# Patient Record
Sex: Male | Born: 1975 | Race: Black or African American | Hispanic: No | Marital: Single | Smoking: Never smoker
Health system: Southern US, Community
[De-identification: ages and names within clinical notes are randomized; demographics above are authoritative.]

## PROBLEM LIST (undated history)

## (undated) HISTORY — PX: HERNIA REPAIR: SHX51

---

## 2001-04-14 ENCOUNTER — Encounter: Payer: Self-pay | Admitting: *Deleted

## 2001-04-14 ENCOUNTER — Emergency Department (HOSPITAL_COMMUNITY): Admission: EM | Admit: 2001-04-14 | Discharge: 2001-04-14 | Payer: Self-pay | Admitting: *Deleted

## 2002-01-28 ENCOUNTER — Encounter: Payer: Self-pay | Admitting: Emergency Medicine

## 2002-01-28 ENCOUNTER — Emergency Department (HOSPITAL_COMMUNITY): Admission: EM | Admit: 2002-01-28 | Discharge: 2002-01-28 | Payer: Self-pay | Admitting: Emergency Medicine

## 2003-01-29 ENCOUNTER — Emergency Department (HOSPITAL_COMMUNITY): Admission: EM | Admit: 2003-01-29 | Discharge: 2003-01-29 | Payer: Self-pay | Admitting: Emergency Medicine

## 2003-04-07 ENCOUNTER — Emergency Department (HOSPITAL_COMMUNITY): Admission: EM | Admit: 2003-04-07 | Discharge: 2003-04-07 | Payer: Self-pay | Admitting: Emergency Medicine

## 2004-02-16 ENCOUNTER — Emergency Department (HOSPITAL_COMMUNITY): Admission: EM | Admit: 2004-02-16 | Discharge: 2004-02-16 | Payer: Self-pay | Admitting: Emergency Medicine

## 2004-04-01 ENCOUNTER — Emergency Department (HOSPITAL_COMMUNITY): Admission: EM | Admit: 2004-04-01 | Discharge: 2004-04-01 | Payer: Self-pay | Admitting: Emergency Medicine

## 2004-05-20 ENCOUNTER — Emergency Department (HOSPITAL_COMMUNITY): Admission: EM | Admit: 2004-05-20 | Discharge: 2004-05-20 | Payer: Self-pay | Admitting: Emergency Medicine

## 2005-01-31 ENCOUNTER — Emergency Department (HOSPITAL_COMMUNITY): Admission: EM | Admit: 2005-01-31 | Discharge: 2005-01-31 | Payer: Self-pay | Admitting: Emergency Medicine

## 2006-04-27 ENCOUNTER — Emergency Department (HOSPITAL_COMMUNITY): Admission: EM | Admit: 2006-04-27 | Discharge: 2006-04-27 | Payer: Self-pay | Admitting: Emergency Medicine
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
## 2007-02-22 ENCOUNTER — Emergency Department (HOSPITAL_COMMUNITY): Admission: EM | Admit: 2007-02-22 | Discharge: 2007-02-22 | Payer: Self-pay | Admitting: Emergency Medicine

## 2007-06-28 ENCOUNTER — Emergency Department (HOSPITAL_COMMUNITY): Admission: EM | Admit: 2007-06-28 | Discharge: 2007-06-28 | Payer: Self-pay | Admitting: Emergency Medicine

## 2008-04-29 ENCOUNTER — Emergency Department (HOSPITAL_COMMUNITY): Admission: EM | Admit: 2008-04-29 | Discharge: 2008-04-29 | Payer: Self-pay | Admitting: Emergency Medicine

## 2008-05-09 ENCOUNTER — Emergency Department (HOSPITAL_COMMUNITY): Admission: EM | Admit: 2008-05-09 | Discharge: 2008-05-09 | Payer: Self-pay | Admitting: Emergency Medicine

## 2008-05-16 ENCOUNTER — Emergency Department (HOSPITAL_COMMUNITY): Admission: EM | Admit: 2008-05-16 | Discharge: 2008-05-16 | Payer: Self-pay | Admitting: Emergency Medicine

## 2009-06-09 ENCOUNTER — Emergency Department (HOSPITAL_BASED_OUTPATIENT_CLINIC_OR_DEPARTMENT_OTHER): Admission: EM | Admit: 2009-06-09 | Discharge: 2009-06-09 | Payer: Self-pay | Admitting: Emergency Medicine

## 2009-10-04 ENCOUNTER — Emergency Department (HOSPITAL_COMMUNITY): Admission: EM | Admit: 2009-10-04 | Discharge: 2009-10-04 | Payer: Self-pay | Admitting: Emergency Medicine

## 2009-10-11 ENCOUNTER — Emergency Department (HOSPITAL_COMMUNITY): Admission: EM | Admit: 2009-10-11 | Discharge: 2009-10-11 | Payer: Self-pay | Admitting: Emergency Medicine

## 2009-11-16 ENCOUNTER — Emergency Department (HOSPITAL_BASED_OUTPATIENT_CLINIC_OR_DEPARTMENT_OTHER): Admission: EM | Admit: 2009-11-16 | Discharge: 2009-11-16 | Payer: Self-pay | Admitting: Emergency Medicine

## 2009-12-10 ENCOUNTER — Emergency Department (HOSPITAL_BASED_OUTPATIENT_CLINIC_OR_DEPARTMENT_OTHER): Admission: EM | Admit: 2009-12-10 | Discharge: 2009-12-10 | Payer: Self-pay | Admitting: Emergency Medicine

## 2009-12-18 ENCOUNTER — Emergency Department (HOSPITAL_BASED_OUTPATIENT_CLINIC_OR_DEPARTMENT_OTHER): Admission: EM | Admit: 2009-12-18 | Discharge: 2009-12-18 | Payer: Self-pay | Admitting: Emergency Medicine

## 2011-06-01 ENCOUNTER — Emergency Department (HOSPITAL_BASED_OUTPATIENT_CLINIC_OR_DEPARTMENT_OTHER)
Admission: EM | Admit: 2011-06-01 | Discharge: 2011-06-01 | Disposition: A | Discharge status: Home / Self Care | Payer: Self-pay | Attending: Emergency Medicine | Admitting: Emergency Medicine

## 2011-06-01 DIAGNOSIS — S335XXA Sprain of ligaments of lumbar spine, initial encounter: Secondary | ICD-10-CM | POA: Insufficient documentation

## 2011-06-01 DIAGNOSIS — S39012A Strain of muscle, fascia and tendon of lower back, initial encounter: Secondary | ICD-10-CM

## 2011-06-01 DIAGNOSIS — X58XXXA Exposure to other specified factors, initial encounter: Secondary | ICD-10-CM | POA: Insufficient documentation

## 2011-06-01 DIAGNOSIS — M5430 Sciatica, unspecified side: Secondary | ICD-10-CM | POA: Insufficient documentation

## 2011-06-01 MED ORDER — IBUPROFEN 800 MG PO TABS: 800.0000 mg | ORAL_TABLET | Freq: Three times a day (TID) | ORAL | Status: AC

## 2011-06-01 MED ORDER — PREDNISONE 10 MG PO TABS
50.0000 mg | ORAL_TABLET | Freq: Once | ORAL | Status: AC
  Administered 2011-06-01: 50 mg via ORAL
  Filled 2011-06-01: qty 5

## 2011-06-01 MED ORDER — PERCOCET 5-325 MG PO TABS: 1.0000 | ORAL_TABLET | Freq: Four times a day (QID) | ORAL | Status: AC | PRN

## 2011-06-01 MED ORDER — DIAZEPAM 5 MG PO TABS: 5.0000 mg | ORAL_TABLET | Freq: Two times a day (BID) | ORAL | Status: AC

## 2011-06-01 MED ORDER — KETOROLAC TROMETHAMINE 30 MG/ML IJ SOLN
30.0000 mg | Freq: Once | INTRAMUSCULAR | Status: AC
  Administered 2011-06-01: 30 mg via INTRAMUSCULAR
  Filled 2011-06-01: qty 1

## 2011-06-01 NOTE — ED Notes (Signed)
Pt reports back pain, radiating to bilateral legs x 6 days.  He denies a specific injury.

## 2011-06-01 NOTE — ED Provider Notes (Addendum)
History     CSN: [id number] Arrival date & time: 06/01/2011 10:57 AM   No chief complaint on file.    (Include location/radiation/quality/duration/timing/severity/associated sxs/prior treatment) Patient is a 35 y.o. male presenting with back pain. The history is provided by the patient.  Back Pain  This is a recurrent problem. The symptoms are aggravated by certain positions.   the patient is currently in a weightlifting class. He is unsure whether he injured his back lifting weights he did have a similar problem years ago. The pain is mostly along the bilateral lower back with some radiation to the buttock and legs. He denies any difficulty with bowels or bladder he does have occasional tingling in his legs. The pain is made worse by coughing or sitting position. He denies any focal motor deficits taste denies any abdominal pain fever or any other systemic symptoms   No past medical history on file.   No past surgical history on file.  No family history on file.  History  Substance Use Topics  . Smoking status: Not on file  . Smokeless tobacco: Not on file  . Alcohol Use: Not on file      Review of Systems  Musculoskeletal: Positive for back pain.  All other systems reviewed and are negative.    Allergies  Review of patient's allergies indicates not on file.  Home Medications  No current outpatient prescriptions on file.  Physical Exam    There were no vitals taken for this visit.  Physical Exam  Constitutional: He is oriented to person, place, and time. He appears well-developed and well-nourished.  HENT:  Head: Normocephalic and atraumatic.  Eyes: Conjunctivae and EOM are normal. Pupils are equal, round, and reactive to light.  Neck: Neck supple.  Cardiovascular: Normal rate and regular rhythm.  Exam reveals no gallop and no friction rub.   No murmur heard. Pulmonary/Chest: Breath sounds normal. He has no wheezes. He has no rales. He exhibits no tenderness.    Abdominal: Soft. Bowel sounds are normal. He exhibits no distension. There is no tenderness. There is no rebound and no guarding.  Musculoskeletal: Normal range of motion.  Neurological: He is alert and oriented to person, place, and time. He displays normal reflexes. No cranial nerve deficit. He exhibits normal muscle tone. Coordination normal.  Skin: Skin is warm and dry. No rash noted.  Psychiatric: He has a normal mood and affect.    ED Course  Procedures  No results found for this or any previous visit. No results found.   No diagnosis found.   MDM Pt is seen and examined;  Initial history and physical completed.  Will follow.         Shacoya Burkhammer A. Patrica Duel, MD 06/01/11 1110  Ladasha Schnackenberg A. Patrica Duel, MD 06/01/11 1217  Pain likely consistent with a bulging disc or sciatica. Patient is given medications for pain and muscle relaxation anti-inflammatory effect he is referred to both the orthopedic specialist as well as a sports medicine specialist patient was strongly encouraged to pursue outpatient followup he is encouraged to return to the ED at any time for concerns or changing symptoms he is discharged home in stable and improved condition  Beatriz Settles A. Patrica Duel, MD 06/01/11 1219

## 2011-06-01 NOTE — ED Notes (Signed)
MD at bedside. 

## 2011-06-16 LAB — POCT I-STAT, CHEM 8
BUN: 17
Creatinine, Ser: 1.3
Glucose, Bld: 89
HCT: 46

## 2012-10-24 ENCOUNTER — Telehealth (HOSPITAL_COMMUNITY): Payer: Self-pay | Admitting: Emergency Medicine

## 2012-10-24 ENCOUNTER — Emergency Department (HOSPITAL_BASED_OUTPATIENT_CLINIC_OR_DEPARTMENT_OTHER)
Admission: EM | Admit: 2012-10-24 | Discharge: 2012-10-24 | Disposition: A | Discharge status: Home / Self Care | Payer: Self-pay | Attending: Emergency Medicine | Admitting: Emergency Medicine

## 2012-10-24 ENCOUNTER — Encounter (HOSPITAL_BASED_OUTPATIENT_CLINIC_OR_DEPARTMENT_OTHER): Payer: Self-pay | Admitting: Family Medicine

## 2012-10-24 DIAGNOSIS — Z9889 Other specified postprocedural states: Secondary | ICD-10-CM | POA: Insufficient documentation

## 2012-10-24 DIAGNOSIS — R509 Fever, unspecified: Secondary | ICD-10-CM | POA: Insufficient documentation

## 2012-10-24 DIAGNOSIS — K0889 Other specified disorders of teeth and supporting structures: Secondary | ICD-10-CM

## 2012-10-24 DIAGNOSIS — K089 Disorder of teeth and supporting structures, unspecified: Secondary | ICD-10-CM | POA: Insufficient documentation

## 2012-10-24 MED ORDER — OXYCODONE-ACETAMINOPHEN 5-325 MG PO TABS: 1.0000 | ORAL_TABLET | Freq: Four times a day (QID) | ORAL | Status: DC | PRN

## 2012-10-24 MED ORDER — PENICILLIN V POTASSIUM 500 MG PO TABS: 500.0000 mg | ORAL_TABLET | Freq: Four times a day (QID) | ORAL | Status: AC

## 2012-10-24 NOTE — ED Provider Notes (Signed)
History     CSN: [id number]  Arrival date & time 10/24/12  4098   First MD Initiated Contact with Patient 10/24/12 0913      Chief Complaint  Patient presents with  . Dental Pain    (Consider location/radiation/quality/duration/timing/severity/associated sxs/prior treatment) HPI PT reports moderate to severe aching pain in L lower 1st molar about 2 weeks ago improved with ibuprofen but returned and more severe last night. Subjective fever at home, but no drainage. History reviewed. No pertinent past medical history.  History reviewed. No pertinent past surgical history.  No family history on file.  History  Substance Use Topics  . Smoking status: Never Smoker   . Smokeless tobacco: Never Used  . Alcohol Use: Yes      Review of Systems All other systems reviewed and are negative except as noted in HPI.   Allergies  Review of patient's allergies indicates no known allergies.  Home Medications  No current outpatient prescriptions on file.  BP 144/101  Pulse 62  Temp 98.4 F (36.9 C) (Oral)  Resp 16  Ht 6' (1.829 m)  Wt 185 lb (83.915 kg)  BMI 25.09 kg/m2  SpO2 100%  Physical Exam  Nursing note and vitals reviewed. Constitutional: He is oriented to person, place, and time. He appears well-developed and well-nourished.  HENT:  Head: Normocephalic and atraumatic.  Mouth/Throat:         The left lower first molar is implanted medially at baseline, no abscess  Eyes: EOM are normal. Pupils are equal, round, and reactive to light.  Neck: Normal range of motion. Neck supple.  Cardiovascular: Normal rate, normal heart sounds and intact distal pulses.   Pulmonary/Chest: Effort normal and breath sounds normal.  Abdominal: Bowel sounds are normal. He exhibits no distension. There is no tenderness.  Musculoskeletal: Normal range of motion. He exhibits no edema and no tenderness.  Neurological: He is alert and oriented to person, place, and time. He has normal  strength. No cranial nerve deficit or sensory deficit.  Skin: Skin is warm and dry. No rash noted.  Psychiatric: He has a normal mood and affect.    ED Course  Procedures (including critical care time)  Labs Reviewed - No data to display No results found.   No diagnosis found.    MDM  Pt with congenitally abnormally located tooth, will begin PCN/Percocet, advised close dental follow up for extraction        Charles B. Bernette Mayers, MD 10/24/12 [phone]

## 2012-10-24 NOTE — ED Notes (Signed)
Call from on call dentist requesting demographics and referral be faxed to their office. 

## 2012-10-24 NOTE — ED Notes (Addendum)
Pt c/o left lower dental pain for "a while" and sts he does not have a dentist. Pt concerned about infection. No fever. Pt reports taking 800mg  Ibuprofen about an hour ago.

## 2012-11-05 ENCOUNTER — Encounter (HOSPITAL_BASED_OUTPATIENT_CLINIC_OR_DEPARTMENT_OTHER): Payer: Self-pay | Admitting: *Deleted

## 2012-11-05 ENCOUNTER — Emergency Department (HOSPITAL_BASED_OUTPATIENT_CLINIC_OR_DEPARTMENT_OTHER)
Admission: EM | Admit: 2012-11-05 | Discharge: 2012-11-05 | Disposition: A | Discharge status: Home / Self Care | Payer: Self-pay | Attending: Emergency Medicine | Admitting: Emergency Medicine

## 2012-11-05 DIAGNOSIS — K0889 Other specified disorders of teeth and supporting structures: Secondary | ICD-10-CM

## 2012-11-05 DIAGNOSIS — R52 Pain, unspecified: Secondary | ICD-10-CM | POA: Insufficient documentation

## 2012-11-05 DIAGNOSIS — IMO0001 Reserved for inherently not codable concepts without codable children: Secondary | ICD-10-CM | POA: Insufficient documentation

## 2012-11-05 DIAGNOSIS — K089 Disorder of teeth and supporting structures, unspecified: Secondary | ICD-10-CM | POA: Insufficient documentation

## 2012-11-05 MED ORDER — PENICILLIN V POTASSIUM 500 MG PO TABS: 500.0000 mg | ORAL_TABLET | Freq: Four times a day (QID) | ORAL | Status: DC

## 2012-11-05 MED ORDER — HYDROCODONE-ACETAMINOPHEN 5-325 MG PO TABS
1.0000 | ORAL_TABLET | Freq: Four times a day (QID) | ORAL | Status: DC | PRN
Start: 2012-11-05 — End: 2022-01-19

## 2012-11-05 NOTE — ED Notes (Signed)
Had 3 teeth extracted 11 days ago after being referred to dr Lucky Cowboy from ED states started having worse pain in the extraction area on Sunday and is getting worse also reports having body aches onset yesterday

## 2012-11-05 NOTE — ED Provider Notes (Signed)
History     CSN: [id number]  Arrival date & time 11/05/12  1023   First MD Initiated Contact with Patient 11/05/12 1041      Chief Complaint  Patient presents with  . Dental Pain  . Generalized Body Aches    (Consider location/radiation/quality/duration/timing/severity/associated sxs/prior treatment) Patient is a 37 y.o. male presenting with tooth pain. The history is provided by the patient.  Dental PainPrimary symptoms do not include headaches, fever or shortness of breath.  Additional symptoms do not include: trouble swallowing.   status post extraction of 3 teeth about one week ago. By Dr. Lucky Cowboy of dentistry. Patient now has increased pain at the site of the 3 extractions. Associated with bodyaches feeling. The abdominal pain is 8/10. All related to the left lower part of the jaw. That is the side of the extractions.  History reviewed. No pertinent past medical history.  History reviewed. No pertinent past surgical history.  History reviewed. No pertinent family history.  History  Substance Use Topics  . Smoking status: Never Smoker   . Smokeless tobacco: Never Used  . Alcohol Use: Yes      Review of Systems  Constitutional: Negative for fever and chills.  HENT: Positive for dental problem. Negative for congestion, trouble swallowing and neck stiffness.   Eyes: Negative for redness.  Respiratory: Negative for shortness of breath.   Cardiovascular: Negative for chest pain.  Gastrointestinal: Negative for abdominal pain.  Genitourinary: Negative for dysuria.  Musculoskeletal: Positive for myalgias.  Skin: Negative for rash.  Neurological: Negative for headaches.  Hematological: Does not bruise/bleed easily.    Allergies  Review of patient's allergies indicates no known allergies.  Home Medications   Current Outpatient Rx  Name  Route  Sig  Dispense  Refill  . HYDROcodone-acetaminophen (NORCO/VICODIN) 5-325 MG per tablet   Oral   Take 1-2 tablets by mouth  every 6 (six) hours as needed for pain.   15 tablet   0   . oxyCODONE-acetaminophen (PERCOCET/ROXICET) 5-325 MG per tablet   Oral   Take 1-2 tablets by mouth every 6 (six) hours as needed for pain.   20 tablet   0   . penicillin v potassium (VEETID) 500 MG tablet   Oral   Take 1 tablet (500 mg total) by mouth 4 (four) times daily.   40 tablet   0     SpO2 99%  Physical Exam  Nursing note and vitals reviewed. Constitutional: He is oriented to person, place, and time. He appears well-developed and well-nourished.  HENT:  Head: Normocephalic and atraumatic.  Left lower jaw area with healing tooth extraction sites. Some gum swelling no erythema no discharge. No facial swelling.  Eyes: Conjunctivae and EOM are normal. Pupils are equal, round, and reactive to light.  Neck: Normal range of motion. Neck supple.  Cardiovascular: Normal rate, regular rhythm and normal heart sounds.   No murmur heard. Pulmonary/Chest: Effort normal and breath sounds normal.  Abdominal: Soft. Bowel sounds are normal. There is no tenderness.  Lymphadenopathy:    He has no cervical adenopathy.  Neurological: He is alert and oriented to person, place, and time. No cranial nerve deficit. He exhibits normal muscle tone. Coordination normal.  Skin: No rash noted.    ED Course  Procedures (including critical care time)  Labs Reviewed - No data to display No results found.   1. Pain, dental       MDM   Patient status post extraction of 3 teeth one  week ago by Dr. Lucky Cowboy. Patient having some increased pain of late. There is some gum swelling but no evidence of erythema or discharge. Will treat again with penicillin and pain medications and refer back to Dr. Lucky Cowboy.        Shelda Jakes, MD 11/05/12 1106

## 2014-05-07 ENCOUNTER — Emergency Department (HOSPITAL_BASED_OUTPATIENT_CLINIC_OR_DEPARTMENT_OTHER)
Admission: EM | Admit: 2014-05-07 | Discharge: 2014-05-07 | Disposition: A | Discharge status: Home / Self Care | Payer: Self-pay | Attending: Emergency Medicine | Admitting: Emergency Medicine

## 2014-05-07 ENCOUNTER — Encounter (HOSPITAL_BASED_OUTPATIENT_CLINIC_OR_DEPARTMENT_OTHER): Payer: Self-pay | Admitting: Emergency Medicine

## 2014-05-07 ENCOUNTER — Emergency Department (HOSPITAL_BASED_OUTPATIENT_CLINIC_OR_DEPARTMENT_OTHER): Payer: Self-pay

## 2014-05-07 DIAGNOSIS — W19XXXA Unspecified fall, initial encounter: Secondary | ICD-10-CM

## 2014-05-07 DIAGNOSIS — Z792 Long term (current) use of antibiotics: Secondary | ICD-10-CM | POA: Insufficient documentation

## 2014-05-07 DIAGNOSIS — Z791 Long term (current) use of non-steroidal anti-inflammatories (NSAID): Secondary | ICD-10-CM | POA: Insufficient documentation

## 2014-05-07 DIAGNOSIS — S8990XA Unspecified injury of unspecified lower leg, initial encounter: Secondary | ICD-10-CM | POA: Insufficient documentation

## 2014-05-07 DIAGNOSIS — S60229A Contusion of unspecified hand, initial encounter: Secondary | ICD-10-CM | POA: Insufficient documentation

## 2014-05-07 DIAGNOSIS — Y9389 Activity, other specified: Secondary | ICD-10-CM | POA: Insufficient documentation

## 2014-05-07 DIAGNOSIS — M25562 Pain in left knee: Secondary | ICD-10-CM

## 2014-05-07 DIAGNOSIS — S99919A Unspecified injury of unspecified ankle, initial encounter: Secondary | ICD-10-CM

## 2014-05-07 DIAGNOSIS — Y929 Unspecified place or not applicable: Secondary | ICD-10-CM | POA: Insufficient documentation

## 2014-05-07 DIAGNOSIS — Z79899 Other long term (current) drug therapy: Secondary | ICD-10-CM | POA: Insufficient documentation

## 2014-05-07 DIAGNOSIS — S60221A Contusion of right hand, initial encounter: Secondary | ICD-10-CM

## 2014-05-07 DIAGNOSIS — S6990XA Unspecified injury of unspecified wrist, hand and finger(s), initial encounter: Secondary | ICD-10-CM | POA: Insufficient documentation

## 2014-05-07 DIAGNOSIS — S99929A Unspecified injury of unspecified foot, initial encounter: Secondary | ICD-10-CM

## 2014-05-07 DIAGNOSIS — R296 Repeated falls: Secondary | ICD-10-CM | POA: Insufficient documentation

## 2014-05-07 MED ORDER — IBUPROFEN 800 MG PO TABS
800.0000 mg | ORAL_TABLET | Freq: Once | ORAL | Status: AC
  Administered 2014-05-07: 800 mg via ORAL
  Filled 2014-05-07: qty 1

## 2014-05-07 MED ORDER — OXYCODONE-ACETAMINOPHEN 5-325 MG PO TABS
2.0000 | ORAL_TABLET | Freq: Once | ORAL | Status: AC
  Administered 2014-05-07: 2 via ORAL
  Filled 2014-05-07: qty 2

## 2014-05-07 MED ORDER — IBUPROFEN 800 MG PO TABS: 800.0000 mg | ORAL_TABLET | Freq: Three times a day (TID) | ORAL | Status: DC
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
## 2014-05-07 NOTE — Discharge Instructions (Signed)
Contusion °A contusion is a deep bruise. Contusions are the result of an injury that caused bleeding under the skin. The contusion may turn blue, purple, or yellow. Minor injuries will give you a painless contusion, but more severe contusions may stay painful and swollen for a few weeks.  °CAUSES  °A contusion is usually caused by a blow, trauma, or direct force to an area of the body. °SYMPTOMS  °· Swelling and redness of the injured area. °· Bruising of the injured area. °· Tenderness and soreness of the injured area. °· Pain. °DIAGNOSIS  °The diagnosis can be made by taking a history and physical exam. An X-Mikailah Morel, CT scan, or MRI may be needed to determine if there were any associated injuries, such as fractures. °TREATMENT  °Specific treatment will depend on what area of the body was injured. In general, the best treatment for a contusion is resting, icing, elevating, and applying cold compresses to the injured area. Over-the-counter medicines may also be recommended for pain control. Ask your caregiver what the best treatment is for your contusion. °HOME CARE INSTRUCTIONS  °· Put ice on the injured area. °¨ Put ice in a plastic bag. °¨ Place a towel between your skin and the bag. °¨ Leave the ice on for 15-20 minutes, 3-4 times a day, or as directed by your health care provider. °· Only take over-the-counter or prescription medicines for pain, discomfort, or fever as directed by your caregiver. Your caregiver may recommend avoiding anti-inflammatory medicines (aspirin, ibuprofen, and naproxen) for 48 hours because these medicines may increase bruising. °· Rest the injured area. °· If possible, elevate the injured area to reduce swelling. °SEEK IMMEDIATE MEDICAL CARE IF:  °· You have increased bruising or swelling. °· You have pain that is getting worse. °· Your swelling or pain is not relieved with medicines. °MAKE SURE YOU:  °· Understand these instructions. °· Will watch your condition. °· Will get help right  away if you are not doing well or get worse. °Document Released: 06/14/2005 Document Revised: 09/09/2013 Document Reviewed: 07/10/2011 °[employer]® Patient Information ©2015 [employer]. This information is not intended to replace advice given to you by your health care provider. Make sure you discuss any questions you have with your health care provider. ° °

## 2014-05-07 NOTE — ED Notes (Signed)
Pt c/o fall from standing this am injuring left knee and right hand .

## 2014-05-13 NOTE — ED Provider Notes (Signed)
CSN: [id number]     Arrival date & time 05/07/14  1407 History   First MD Initiated Contact with Patient 05/07/14 1414     Chief Complaint  Patient presents with  . Fall     (Consider location/radiation/quality/duration/timing/severity/associated sxs/prior Treatment) HPI Comments: Patient fell from standing position complains of pain to right hand and left knee.    Patient is a 38 y.o. male presenting with fall. The history is provided by the patient.  Fall This is a new problem. The current episode started 3 to 5 hours ago. The problem occurs constantly. The problem has not changed since onset.Pertinent negatives include no chest pain, no abdominal pain, no headaches and no shortness of breath. The symptoms are aggravated by walking and twisting. The symptoms are relieved by position. He has tried nothing for the symptoms. The treatment provided no relief.    History reviewed. No pertinent past medical history. History reviewed. No pertinent past surgical history. History reviewed. No pertinent family history. History  Substance Use Topics  . Smoking status: Never Smoker   . Smokeless tobacco: Never Used  . Alcohol Use: Yes    Review of Systems  Respiratory: Negative for shortness of breath.   Cardiovascular: Negative for chest pain.  Gastrointestinal: Negative for abdominal pain.  Neurological: Negative for headaches.  All other systems reviewed and are negative.     Allergies  Review of patient's allergies indicates no known allergies.  Home Medications   Prior to Admission medications   Medication Sig Start Date End Date Taking? Authorizing Provider  HYDROcodone-acetaminophen (NORCO/VICODIN) 5-325 MG per tablet Take 1-2 tablets by mouth every 6 (six) hours as needed for pain. 11/05/12   Vanetta Mulders, MD  ibuprofen (ADVIL,MOTRIN) 800 MG tablet Take 1 tablet (800 mg total) by mouth 3 (three) times daily. 05/07/14   Hilario Quarry, MD  oxyCODONE-acetaminophen  (PERCOCET/ROXICET) 5-325 MG per tablet Take 1-2 tablets by mouth every 6 (six) hours as needed for pain. 10/24/12   Charles B. Bernette Mayers, MD  penicillin v potassium (VEETID) 500 MG tablet Take 1 tablet (500 mg total) by mouth 4 (four) times daily. 11/05/12   Vanetta Mulders, MD   BP 135/75  Pulse 69  Temp(Src) 98.2 F (36.8 C) (Oral)  Resp 16  Ht 6' (1.829 m)  Wt 190 lb (86.183 kg)  BMI 25.76 kg/m2  SpO2 98% Physical Exam  Nursing note and vitals reviewed. Constitutional: He is oriented to person, place, and time. He appears well-developed and well-nourished.  HENT:  Head: Normocephalic and atraumatic.  Right Ear: External ear normal.  Left Ear: External ear normal.  Nose: Nose normal.  Mouth/Throat: Oropharynx is clear and moist.  Eyes: Conjunctivae and EOM are normal. Pupils are equal, round, and reactive to light.  Neck: Normal range of motion. Neck supple.  Cardiovascular: Normal rate, regular rhythm, normal heart sounds and intact distal pulses.   Pulmonary/Chest: Effort normal and breath sounds normal. No respiratory distress. He has no wheezes. He exhibits no tenderness.  Abdominal: Soft. Bowel sounds are normal. He exhibits no distension and no mass. There is no tenderness. There is no guarding.  Musculoskeletal: Normal range of motion.  Moderate ttp ulnar aspect of right hand, no deformity, no loss of sensation MIld ttp left knee, no deformity, full arom, no ligamentous laxity noted.    Neurological: He is alert and oriented to person, place, and time. He has normal reflexes. He exhibits normal muscle tone. Coordination normal.  Skin: Skin is warm and  dry.  Psychiatric: He has a normal mood and affect. His behavior is normal. Judgment and thought content normal.    ED Course  Procedures (including critical care time) Labs Review Dg Knee Complete 4 Views Left  05/07/2014   CLINICAL DATA:  Fall, twisting injury, posterior pain  EXAM: LEFT KNEE - COMPLETE 4+ VIEW  COMPARISON:   None.  FINDINGS: There is no evidence of fracture, dislocation, or joint effusion. There is no evidence of arthropathy or other focal bone abnormality. Soft tissues are unremarkable.  IMPRESSION: No acute osseous finding   Electronically Signed   By: Ruel Favors M.D.   On: 05/07/2014 14:35   Dg Hand Complete Right  05/07/2014   CLINICAL DATA:  FALL.  Swelling and pain in the hand.  EXAM: RIGHT HAND - COMPLETE 3+ VIEW  COMPARISON:  04/27/2006  FINDINGS: There is no evidence of fracture or dislocation. There is no evidence of arthropathy or other focal bone abnormality. Soft tissues are unremarkable.  IMPRESSION: Negative.   Electronically Signed   By: Rosalie Gums M.D.   On: 05/07/2014 14:34    MDM   Final diagnoses:  Fall, initial encounter  Hand contusion, right, initial encounter  Knee pain, acute, left        Hilario Quarry, MD 05/13/14 [phone]

## 2014-06-30 ENCOUNTER — Emergency Department (HOSPITAL_BASED_OUTPATIENT_CLINIC_OR_DEPARTMENT_OTHER): Payer: No Typology Code available for payment source

## 2014-06-30 ENCOUNTER — Emergency Department (HOSPITAL_BASED_OUTPATIENT_CLINIC_OR_DEPARTMENT_OTHER)
Admission: EM | Admit: 2014-06-30 | Discharge: 2014-06-30 | Disposition: A | Discharge status: Home / Self Care | Payer: No Typology Code available for payment source | Attending: Emergency Medicine | Admitting: Emergency Medicine

## 2014-06-30 ENCOUNTER — Encounter (HOSPITAL_BASED_OUTPATIENT_CLINIC_OR_DEPARTMENT_OTHER): Payer: Self-pay | Admitting: Emergency Medicine

## 2014-06-30 DIAGNOSIS — Z792 Long term (current) use of antibiotics: Secondary | ICD-10-CM | POA: Insufficient documentation

## 2014-06-30 DIAGNOSIS — Y9389 Activity, other specified: Secondary | ICD-10-CM | POA: Insufficient documentation

## 2014-06-30 DIAGNOSIS — Z79899 Other long term (current) drug therapy: Secondary | ICD-10-CM | POA: Insufficient documentation

## 2014-06-30 DIAGNOSIS — Z791 Long term (current) use of non-steroidal anti-inflammatories (NSAID): Secondary | ICD-10-CM | POA: Diagnosis not present

## 2014-06-30 DIAGNOSIS — S199XXA Unspecified injury of neck, initial encounter: Secondary | ICD-10-CM | POA: Insufficient documentation

## 2014-06-30 DIAGNOSIS — R202 Paresthesia of skin: Secondary | ICD-10-CM | POA: Diagnosis not present

## 2014-06-30 DIAGNOSIS — S0990XA Unspecified injury of head, initial encounter: Secondary | ICD-10-CM | POA: Diagnosis not present

## 2014-06-30 DIAGNOSIS — M542 Cervicalgia: Secondary | ICD-10-CM

## 2014-06-30 DIAGNOSIS — Y92481 Parking lot as the place of occurrence of the external cause: Secondary | ICD-10-CM | POA: Diagnosis not present

## 2014-06-30 MED ORDER — HYDROCODONE-ACETAMINOPHEN 5-325 MG PO TABS
2.0000 | ORAL_TABLET | Freq: Once | ORAL | Status: AC
  Administered 2014-06-30: 2 via ORAL
  Filled 2014-06-30: qty 2

## 2014-06-30 MED ORDER — OXYCODONE-ACETAMINOPHEN 5-325 MG PO TABS: 1.0000 | ORAL_TABLET | Freq: Four times a day (QID) | ORAL | Status: DC | PRN

## 2014-06-30 NOTE — ED Notes (Signed)
Applied universal aspen collar and reviewed with pt.

## 2014-06-30 NOTE — ED Notes (Signed)
Instructed pt that he would need to wear the aspen collar 24 hours a day until seen by neurosurgeon.  Pt related verbal understanding.
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
## 2014-06-30 NOTE — ED Notes (Signed)
Applied [place] collar to pt while waiting for neurosurgery consult
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
## 2014-11-03 ENCOUNTER — Emergency Department (HOSPITAL_COMMUNITY)
Admission: EM | Admit: 2014-11-03 | Discharge: 2014-11-03 | Disposition: A | Discharge status: Home / Self Care | Payer: BLUE CROSS/BLUE SHIELD | Attending: Emergency Medicine | Admitting: Emergency Medicine

## 2014-11-03 ENCOUNTER — Encounter (HOSPITAL_COMMUNITY): Payer: Self-pay | Admitting: Emergency Medicine

## 2014-11-03 ENCOUNTER — Emergency Department (HOSPITAL_COMMUNITY): Payer: BLUE CROSS/BLUE SHIELD

## 2014-11-03 DIAGNOSIS — M7918 Myalgia, other site: Secondary | ICD-10-CM

## 2014-11-03 DIAGNOSIS — Z792 Long term (current) use of antibiotics: Secondary | ICD-10-CM | POA: Insufficient documentation

## 2014-11-03 DIAGNOSIS — M25562 Pain in left knee: Secondary | ICD-10-CM

## 2014-11-03 DIAGNOSIS — W009XXA Unspecified fall due to ice and snow, initial encounter: Secondary | ICD-10-CM | POA: Insufficient documentation

## 2014-11-03 DIAGNOSIS — Y9289 Other specified places as the place of occurrence of the external cause: Secondary | ICD-10-CM | POA: Diagnosis not present

## 2014-11-03 DIAGNOSIS — Z791 Long term (current) use of non-steroidal anti-inflammatories (NSAID): Secondary | ICD-10-CM | POA: Diagnosis not present

## 2014-11-03 DIAGNOSIS — S3992XA Unspecified injury of lower back, initial encounter: Secondary | ICD-10-CM | POA: Diagnosis not present

## 2014-11-03 DIAGNOSIS — S8992XA Unspecified injury of left lower leg, initial encounter: Secondary | ICD-10-CM | POA: Insufficient documentation

## 2014-11-03 DIAGNOSIS — Z79899 Other long term (current) drug therapy: Secondary | ICD-10-CM | POA: Diagnosis not present

## 2014-11-03 DIAGNOSIS — Y9389 Activity, other specified: Secondary | ICD-10-CM | POA: Diagnosis not present

## 2014-11-03 DIAGNOSIS — Y998 Other external cause status: Secondary | ICD-10-CM | POA: Diagnosis not present

## 2014-11-03 MED ORDER — IBUPROFEN 600 MG PO TABS: 600.0000 mg | ORAL_TABLET | Freq: Four times a day (QID) | ORAL | Status: DC | PRN

## 2014-11-03 MED ORDER — TRAMADOL HCL 50 MG PO TABS: 50.0000 mg | ORAL_TABLET | Freq: Four times a day (QID) | ORAL | Status: DC | PRN

## 2014-11-03 MED ORDER — IBUPROFEN 400 MG PO TABS
600.0000 mg | ORAL_TABLET | Freq: Once | ORAL | Status: AC
  Administered 2014-11-03: 600 mg via ORAL
  Filled 2014-11-03 (×2): qty 1

## 2014-11-03 MED ORDER — DIAZEPAM 5 MG PO TABS: 5.0000 mg | ORAL_TABLET | Freq: Two times a day (BID) | ORAL | Status: DC

## 2014-11-03 NOTE — ED Notes (Signed)
Slipped on concrete steps last pm. C/o LBP and left knee pain. No deformity noted.

## 2014-11-03 NOTE — ED Provider Notes (Signed)
CSN: [id number]     Arrival date & time 11/03/14  1013 History  This chart was scribed for non-physician practitioner, Junius FinnerErin O'Malley, PA-C working with Arby BarretteMarcy Pfeiffer, MD by Greggory StallionKayla Andersen, ED scribe. This patient was seen in room TR11C/TR11C and the patient's care was started at 11:01 AM.   Chief Complaint  Patient presents with  . Fall  . Back Pain  . Knee Pain   The history is provided by the patient. No language interpreter was used.    HPI Comments: Edwin SettingDayton J Berry is a 39 y.o. male who presents to the Emergency Department complaining of a fall that occurred around 2 AM today. Pt slipped on concrete steps, fell onto his buttocks and hyperextended his knee. He reports worsening lower back pain and left knee pain. Bearing weight and bending his knee worsens pain. Pain is aching and sore, 10/10 at worst. Certain movements worsen back pain. He has not yet taken any medications. Pt has injured his left knee in the past and has had intermittent pain since but denies knee surgeries. Denies change in bowel or bladder habits. Denies hitting his head during the fall.  History reviewed. No pertinent past medical history. History reviewed. No pertinent past surgical history. No family history on file. History  Substance Use Topics  . Smoking status: Never Smoker   . Smokeless tobacco: Never Used  . Alcohol Use: Yes     Comment: occ    Review of Systems  Musculoskeletal: Positive for back pain and arthralgias.  All other systems reviewed and are negative.  Allergies  Review of patient's allergies indicates no known allergies.  Home Medications   Prior to Admission medications   Medication Sig Start Date End Date Taking? Authorizing Provider  diazepam (VALIUM) 5 MG tablet Take 1 tablet (5 mg total) by mouth 2 (two) times daily. 11/03/14   Junius FinnerErin O'Malley, PA-C  HYDROcodone-acetaminophen (NORCO/VICODIN) 5-325 MG per tablet Take 1-2 tablets by mouth every 6 (six) hours as needed for pain.  11/05/12   Vanetta MuldersScott Zackowski, MD  ibuprofen (ADVIL,MOTRIN) 600 MG tablet Take 1 tablet (600 mg total) by mouth every 6 (six) hours as needed. 11/03/14   Junius FinnerErin O'Malley, PA-C  ibuprofen (ADVIL,MOTRIN) 800 MG tablet Take 1 tablet (800 mg total) by mouth 3 (three) times daily. 05/07/14   Hilario Quarryanielle S Ray, MD  oxyCODONE-acetaminophen (PERCOCET) 5-325 MG per tablet Take 1 tablet by mouth every 6 (six) hours as needed. 06/30/14   Toy CookeyMegan Docherty, MD  oxyCODONE-acetaminophen (PERCOCET/ROXICET) 5-325 MG per tablet Take 1-2 tablets by mouth every 6 (six) hours as needed for pain. 10/24/12   Charles B. Bernette MayersSheldon, MD  penicillin v potassium (VEETID) 500 MG tablet Take 1 tablet (500 mg total) by mouth 4 (four) times daily. 11/05/12   Vanetta MuldersScott Zackowski, MD  traMADol (ULTRAM) 50 MG tablet Take 1 tablet (50 mg total) by mouth every 6 (six) hours as needed. 11/03/14   Junius FinnerErin O'Malley, PA-C   BP 137/75 mmHg  Pulse 75  Temp(Src) 98.2 F (36.8 C) (Oral)  Resp 20  Wt 185 lb (83.915 kg)  SpO2 95%   Physical Exam  Constitutional: He is oriented to person, place, and time. He appears well-developed and well-nourished.  HENT:  Head: Normocephalic and atraumatic.  Eyes: EOM are normal.  Neck: Normal range of motion.  Cardiovascular: Normal rate.   Pedal pulses and radial pulses 2+.  Pulmonary/Chest: Effort normal.  Musculoskeletal: Normal range of motion.  Left knee with no deformity. Limited knee flexion due  to pain to medial and posterior aspects. No midline spinal tenderness. Mild tenderness to bilateral buttock.   Neurological: He is alert and oriented to person, place, and time.  Antalgic gait.  Skin: Skin is warm and dry.  Skin intact. No erythema or ecchymosis.   Psychiatric: He has a normal mood and affect. His behavior is normal.  Nursing note and vitals reviewed.   ED Course  Procedures (including critical care time)  DIAGNOSTIC STUDIES: Oxygen Saturation is 95% on RA, adequate by my interpretation.     COORDINATION OF CARE: 11:03 AM-Discussed treatment plan which includes imaging and motrin with pt at bedside and pt agreed to plan. Will give pt an orthopedic referral and advised him to follow up if symptoms do not start resolving in 1-2 weeks.   Labs Review Labs Reviewed - No data to display  Imaging Review Dg Sacrum/coccyx  11/03/2014   CLINICAL DATA:  Sacral pain secondary to a fall last night on the ice.  EXAM: SACRUM AND COCCYX - 2+ VIEW  COMPARISON:  CT scan dated 01/31/2005  FINDINGS: There is no fracture. No subluxations. There is new degenerative disc disease at L5-S1 since the prior CT scan.  IMPRESSION: No acute abnormality.  Degenerative disc disease at L5-S1.   Electronically Signed   By: Francene Boyers M.D.   On: 11/03/2014 11:33   Dg Knee Complete 4 Views Left  11/03/2014   CLINICAL DATA:  39 year old male fell on the ice last night with posterior left knee pain and swelling. Initial encounter.  EXAM: LEFT KNEE - COMPLETE 4+ VIEW  COMPARISON:  05/07/2014.  FINDINGS: No definite joint effusion. Bone mineralization is within normal limits. Patella appears intact. Joint spaces are relatively preserved. No fracture or dislocation identified.  IMPRESSION: No acute fracture or dislocation identified about the left knee.   Electronically Signed   By: Odessa Fleming M.D.   On: 11/03/2014 11:34     EKG Interpretation None      MDM   Final diagnoses:  Fall from slipping on ice, initial encounter  Buttock pain  Left knee pain    Pt c/o left knee pain and lower back pain after fall onto ice earlier this morning. No red flag symptoms. No pain medication PTA.  Will tx conservatively for pain. Advised to f/u with PCP in 1 week for recheck of symptoms. Return precautions provided. Pt verbalized understanding and agreement with tx plan.   I personally performed the services described in this documentation, which was scribed in my presence. The recorded information has been reviewed and is  accurate.   Junius Finner, PA-C 11/03/14 1652  Arby Barrette, MD 11/05/14 1710

## 2016-03-01 ENCOUNTER — Encounter (HOSPITAL_COMMUNITY): Payer: Self-pay | Admitting: *Deleted

## 2016-03-01 ENCOUNTER — Emergency Department (HOSPITAL_COMMUNITY)
Admission: EM | Admit: 2016-03-01 | Discharge: 2016-03-01 | Disposition: A | Discharge status: Home / Self Care | Payer: BLUE CROSS/BLUE SHIELD | Attending: Emergency Medicine | Admitting: Emergency Medicine

## 2016-03-01 ENCOUNTER — Emergency Department (HOSPITAL_COMMUNITY): Payer: BLUE CROSS/BLUE SHIELD

## 2016-03-01 DIAGNOSIS — M7918 Myalgia, other site: Secondary | ICD-10-CM

## 2016-03-01 DIAGNOSIS — W010XXA Fall on same level from slipping, tripping and stumbling without subsequent striking against object, initial encounter: Secondary | ICD-10-CM | POA: Insufficient documentation

## 2016-03-01 DIAGNOSIS — M791 Myalgia: Secondary | ICD-10-CM | POA: Insufficient documentation

## 2016-03-01 DIAGNOSIS — W19XXXA Unspecified fall, initial encounter: Secondary | ICD-10-CM

## 2016-03-01 DIAGNOSIS — Z79899 Other long term (current) drug therapy: Secondary | ICD-10-CM | POA: Insufficient documentation

## 2016-03-01 MED ORDER — TETANUS-DIPHTH-ACELL PERTUSSIS 5-2.5-18.5 LF-MCG/0.5 IM SUSP: 0.5000 mL | Freq: Once | INTRAMUSCULAR | Status: DC

## 2016-03-01 MED ORDER — KETOROLAC TROMETHAMINE 30 MG/ML IJ SOLN
30.0000 mg | Freq: Once | INTRAMUSCULAR | Status: AC
  Administered 2016-03-01: 30 mg via INTRAMUSCULAR
  Filled 2016-03-01 (×2): qty 1

## 2016-03-01 MED ORDER — LIDOCAINE HCL (PF) 1 % IJ SOLN: 5.0000 mL | Freq: Once | INTRAMUSCULAR | Status: DC

## 2016-03-01 MED ORDER — IBUPROFEN 600 MG PO TABS: 600.0000 mg | ORAL_TABLET | Freq: Four times a day (QID) | ORAL | Status: DC | PRN

## 2016-03-01 NOTE — ED Provider Notes (Signed)
CSN: [id number]     Arrival date & time 03/01/16  1203 History  By signing my name below, I, Iona Beard, attest that this documentation has been prepared under the direction and in the presence of [employer], PA-C.  Electronically Signed: Iona Beard, ED Scribe 03/01/2016 at 2:13 PM.   Chief Complaint  Patient presents with  . Knee Pain    The history is provided by the patient. No language interpreter was used.   HPI Comments: Edwin Berry is a 40 y.o. male who presents to the Emergency Department via EMS complaining of sudden onset, posterior left knee pain s/p fall this morning in which he landed on his tailbone and elbow. No LOC or head trauma in the incident. Pt reports the pain is radiating down into his left foot. Pt is also complaining of lower back pain and bilateral elbow pain. No other associated symptoms noted. No worsening or alleviating factors noted. Pt denies numbness, tingling, weakness, or any other pertinent symptoms.  History reviewed. No pertinent past medical history. History reviewed. No pertinent past surgical history. History reviewed. No pertinent family history. Social History  Substance Use Topics  . Smoking status: Never Smoker   . Smokeless tobacco: Never Used  . Alcohol Use: Yes     Comment: occ    Review of Systems A complete 10 system review of systems was obtained and all systems are negative except as noted in the HPI and PMH.     Allergies  Review of patient's allergies indicates no known allergies.  Home Medications   Prior to Admission medications   Medication Sig Start Date End Date Taking? Authorizing Provider  diazepam (VALIUM) 5 MG tablet Take 1 tablet (5 mg total) by mouth 2 (two) times daily. 11/03/14   Junius Finner, PA-C  HYDROcodone-acetaminophen (NORCO/VICODIN) 5-325 MG per tablet Take 1-2 tablets by mouth every 6 (six) hours as needed for pain. 11/05/12   Vanetta Mulders, MD  ibuprofen (ADVIL,MOTRIN) 600 MG  tablet Take 1 tablet (600 mg total) by mouth every 6 (six) hours as needed. 03/01/16   Joycie Peek, PA-C  ibuprofen (ADVIL,MOTRIN) 800 MG tablet Take 1 tablet (800 mg total) by mouth 3 (three) times daily. 05/07/14   Margarita Grizzle, MD  oxyCODONE-acetaminophen (PERCOCET) 5-325 MG per tablet Take 1 tablet by mouth every 6 (six) hours as needed. 06/30/14   Toy Cookey, MD  oxyCODONE-acetaminophen (PERCOCET/ROXICET) 5-325 MG per tablet Take 1-2 tablets by mouth every 6 (six) hours as needed for pain. 10/24/12   Susy Frizzle, MD  penicillin v potassium (VEETID) 500 MG tablet Take 1 tablet (500 mg total) by mouth 4 (four) times daily. 11/05/12   Vanetta Mulders, MD  traMADol (ULTRAM) 50 MG tablet Take 1 tablet (50 mg total) by mouth every 6 (six) hours as needed. 11/03/14   Junius Finner, PA-C   BP 126/70 mmHg  Pulse 66  Temp(Src) 98.8 F (37.1 C)  Resp 16  Ht 6' (1.829 m)  Wt 205 lb (92.987 kg)  BMI 27.80 kg/m2  SpO2 97% Physical Exam  Constitutional: He appears well-developed and well-nourished. No distress.  HENT:  Head: Normocephalic and atraumatic.  Eyes: Conjunctivae and EOM are normal.  Neck: Normal range of motion. Neck supple. No tracheal deviation present.  No neck pain or stiffness  Cardiovascular: Normal rate.   Pulmonary/Chest: Effort normal. No respiratory distress.  Musculoskeletal: Normal range of motion. He exhibits tenderness.       Left knee: Tenderness found.  Bilateral upper  extremities without any focal tenderness, erythema or edema. Full active range of motion.  TTP to posterior aspect of left knee. No ligamentous laxity. No bony tenderness. No erythema, swelling, or obivious signs of effusion present. Distal pulses and sensation intact. Motor strength baseline.  Gait is somewhat antalgic but no ataxia.  Neurological: He is alert.  Skin: Skin is warm and dry.  Psychiatric: He has a normal mood and affect. His behavior is normal.    ED Course  Procedures  (including critical care time) DIAGNOSTIC STUDIES: Oxygen Saturation is 97% on RA, normal by my interpretation.    COORDINATION OF CARE: 12:30 PM Discussed treatment plan with pt at bedside and pt agreed to plan.  Labs Review Labs Reviewed - No data to display  Imaging Review Dg Sacrum/coccyx  03/01/2016  CLINICAL DATA:  Fall today.  Sacrococcygeal pain. EXAM: SACRUM AND COCCYX - 2+ VIEW COMPARISON:  Radiographs 11/03/2014. FINDINGS: No evidence of acute fracture or sacroiliac joint diastasis. Degenerative disc disease at L5-S1 again noted. IMPRESSION: Stable examination.  No acute osseous findings. Electronically Signed   By: Carey BullocksWilliam  Veazey M.D.   On: 03/01/2016 13:24   Dg Knee Complete 4 Views Left  03/01/2016  CLINICAL DATA:  Fall today with left posterior knee pain. Initial encounter. EXAM: LEFT KNEE - COMPLETE 4+ VIEW COMPARISON:  11/03/2014 FINDINGS: No evidence of fracture, dislocation, or joint effusion. No evidence of arthropathy or other focal bone abnormality. Soft tissues are unremarkable. IMPRESSION: Negative. Electronically Signed   By: Marnee SpringJonathon  Watts M.D.   On: 03/01/2016 13:25   I have personally reviewed and evaluated these images as part of my medical decision-making.   EKG Interpretation None      MDM  Musculoskeletal pain secondary to mechanical fall. Patient X-Ray negative for obvious fracture or dislocation. Pain managed in ED. Physical exam without any red flags, no redness, swelling. Remains neurovascularly intact. Pt advised to follow up with orthopedics if symptoms persist for possibility of missed fracture diagnosis. Patient given A knee brace while in ED, conservative therapy recommended and discussed. He requests crutches. Patient will be dc home & is agreeable with above plan.  Final diagnoses:  Fall, initial encounter  Musculoskeletal pain    I personally performed the services described in this documentation, which was scribed in my presence. The  recorded information has been reviewed and is accurate.    Joycie PeekBenjamin Yarrow Linhart, PA-C 03/01/16 1429  Lyndal Pulleyaniel Knott, MD 03/01/16 2004

## 2016-03-01 NOTE — ED Notes (Signed)
Bed: WA28 Expected date:  Expected time:  Means of arrival:  Comments: 

## 2016-03-01 NOTE — Discharge Instructions (Signed)
Your exam was reassuring and your x-rays were negative. You may take ibuprofen as prescribed to help with your discomfort. Follow-up with your doctor as needed. Return to ED for new or worsening symptoms.  Musculoskeletal Pain Musculoskeletal pain is muscle and boney aches and pains. These pains can occur in any part of the body. Your caregiver may treat you without knowing the cause of the pain. They may treat you if blood or urine tests, X-rays, and other tests were normal.  CAUSES There is often not a definite cause or reason for these pains. These pains may be caused by a type of germ (virus). The discomfort may also come from overuse. Overuse includes working out too hard when your body is not fit. Boney aches also come from weather changes. Bone is sensitive to atmospheric pressure changes. HOME CARE INSTRUCTIONS   Ask when your test results will be ready. Make sure you get your test results.  Only take over-the-counter or prescription medicines for pain, discomfort, or fever as directed by your caregiver. If you were given medications for your condition, do not drive, operate machinery or power tools, or sign legal documents for 24 hours. Do not drink alcohol. Do not take sleeping pills or other medications that may interfere with treatment.  Continue all activities unless the activities cause more pain. When the pain lessens, slowly resume normal activities. Gradually increase the intensity and duration of the activities or exercise.  During periods of severe pain, bed rest may be helpful. Lay or sit in any position that is comfortable.  Putting ice on the injured area.  Put ice in a bag.  Place a towel between your skin and the bag.  Leave the ice on for 15 to 20 minutes, 3 to 4 times a day.  Follow up with your caregiver for continued problems and no reason can be found for the pain. If the pain becomes worse or does not go away, it may be necessary to repeat tests or do additional  testing. Your caregiver may need to look further for a possible cause. SEEK IMMEDIATE MEDICAL CARE IF:  You have pain that is getting worse and is not relieved by medications.  You develop chest pain that is associated with shortness or breath, sweating, feeling sick to your stomach (nauseous), or throw up (vomit).  Your pain becomes localized to the abdomen.  You develop any new symptoms that seem different or that concern you. MAKE SURE YOU:   Understand these instructions.  Will watch your condition.  Will get help right away if you are not doing well or get worse.   This information is not intended to replace advice given to you by your health care provider. Make sure you discuss any questions you have with your health care provider.   Document Released: 09/04/2005 Document Revised: 11/27/2011 Document Reviewed: 05/09/2013 Elsevier Interactive Patient Education [employer]2016 Elsevier Inc.

## 2016-03-01 NOTE — ED Notes (Addendum)
Pt states he slipped in [employer] and now has pain top his left knee. and head. Pt also has bilateral elbow pain. He denies loss of conciousness . Pt also c/o sacral pain. Reddness to left upper aspect of arm. No other brusing noted. Pt taken to the x-ray dept via stretcher. (1pm) pt returned from the x-raydept. (1:15pm) Ice packs applied to left knee and pts left elbow. He does have range of motion of his upper and lower extremities.Phoned Ortho tech for a knee immobolizer.2:05pm)Orthop tech saw pt. Knee immobilizer placed on left knee. Pt is steady on his feet and was given crutch walking instructions by Joni Fearslinton the ortho tech. Pt also took ice packs with him.Pt requested a work note and was given 3 days off and told to follow up with his MD. Pt was walked out of the Er using his crutches.

## 2019-01-06 ENCOUNTER — Ambulatory Visit
Admission: RE | Admit: 2019-01-06 | Discharge: 2019-01-06 | Disposition: A | Discharge status: Home / Self Care | Payer: BLUE CROSS/BLUE SHIELD | Source: Ambulatory Visit | Attending: Internal Medicine | Admitting: Internal Medicine

## 2019-01-06 ENCOUNTER — Other Ambulatory Visit: Payer: Self-pay | Admitting: Internal Medicine

## 2019-01-06 ENCOUNTER — Other Ambulatory Visit: Payer: Self-pay

## 2019-01-06 DIAGNOSIS — M79641 Pain in right hand: Secondary | ICD-10-CM

## 2019-04-17 ENCOUNTER — Encounter (HOSPITAL_BASED_OUTPATIENT_CLINIC_OR_DEPARTMENT_OTHER): Payer: Self-pay | Admitting: Emergency Medicine

## 2019-04-17 ENCOUNTER — Emergency Department (HOSPITAL_BASED_OUTPATIENT_CLINIC_OR_DEPARTMENT_OTHER)
Admission: EM | Admit: 2019-04-17 | Discharge: 2019-04-17 | Disposition: A | Discharge status: Home / Self Care | Payer: BC Managed Care – PPO | Attending: Emergency Medicine | Admitting: Emergency Medicine

## 2019-04-17 ENCOUNTER — Other Ambulatory Visit: Payer: Self-pay

## 2019-04-17 DIAGNOSIS — Y93F2 Activity, caregiving, lifting: Secondary | ICD-10-CM | POA: Diagnosis not present

## 2019-04-17 DIAGNOSIS — S3992XA Unspecified injury of lower back, initial encounter: Secondary | ICD-10-CM | POA: Diagnosis present

## 2019-04-17 DIAGNOSIS — S39012A Strain of muscle, fascia and tendon of lower back, initial encounter: Secondary | ICD-10-CM | POA: Insufficient documentation

## 2019-04-17 DIAGNOSIS — Y999 Unspecified external cause status: Secondary | ICD-10-CM | POA: Insufficient documentation

## 2019-04-17 DIAGNOSIS — Y92008 Other place in unspecified non-institutional (private) residence as the place of occurrence of the external cause: Secondary | ICD-10-CM | POA: Diagnosis not present

## 2019-04-17 DIAGNOSIS — X500XXA Overexertion from strenuous movement or load, initial encounter: Secondary | ICD-10-CM | POA: Insufficient documentation

## 2019-04-17 MED ORDER — IBUPROFEN 400 MG PO TABS
600.0000 mg | ORAL_TABLET | Freq: Once | ORAL | Status: AC
  Administered 2019-04-17: 600 mg via ORAL
  Filled 2019-04-17: qty 1

## 2019-04-17 MED ORDER — HYDROMORPHONE HCL 1 MG/ML IJ SOLN
2.0000 mg | Freq: Once | INTRAMUSCULAR | Status: AC
  Administered 2019-04-17: 10:00:00 2 mg via INTRAMUSCULAR
  Filled 2019-04-17: qty 2

## 2019-04-17 MED ORDER — ONDANSETRON 4 MG PO TBDP
4.0000 mg | ORAL_TABLET | Freq: Once | ORAL | Status: AC
  Administered 2019-04-17: 4 mg via ORAL
  Filled 2019-04-17: qty 1

## 2019-04-17 MED ORDER — IBUPROFEN 600 MG PO TABS: 600.0000 mg | ORAL_TABLET | Freq: Three times a day (TID) | ORAL | 0 refills | Status: DC | PRN

## 2019-04-17 MED FILL — IBUPROFEN 600 MG TABLET: 600 | 10 days supply | Qty: 30 | Fill #0

## 2019-04-17 NOTE — Discharge Instructions (Addendum)
If you develop worsening, recurrent, or continued back pain, numbness or weakness in the legs, incontinence of your bowels or bladders, numbness of your buttocks, fever, abdominal pain, or any other new/concerning symptoms then return to the ER for evaluation.  

## 2019-04-17 NOTE — ED Provider Notes (Signed)
[facility] EMERGENCY DEPARTMENT Provider Note   CSN: [id number] Arrival date & time: 04/17/19  0903    History   Chief Complaint Chief Complaint  Patient presents with  . Back Pain    HPI Edwin Berry is a 43 y.o. male.     HPI  43 year old male presents with severe low back pain he was picking up his mom who is debilitated this morning and felt a pop.  He felt numbness in his legs but also states he could feel everything in his legs.  He was on the ground for about an hour because he could not stand up.  Pain is currently severe.  It is mostly in his low back and runs down his legs.  He has had back pain that occasionally runs down his legs for a long time but this is much more severe than typical.  No incontinence or fevers. He can move his legs but they are severely painful.  History reviewed. No pertinent past medical history.  There are no active problems to display for this patient.   History reviewed. No pertinent surgical history.      Home Medications    Prior to Admission medications   Medication Sig Start Date End Date Taking? Authorizing Provider  diazepam (VALIUM) 5 MG tablet Take 1 tablet (5 mg total) by mouth 2 (two) times daily. 11/03/14   Lurene ShadowPhelps, Erin O, PA-C  HYDROcodone-acetaminophen (NORCO/VICODIN) 5-325 MG per tablet Take 1-2 tablets by mouth every 6 (six) hours as needed for pain. 11/05/12   Vanetta MuldersZackowski, Amberia Bayless, MD  ibuprofen (ADVIL) 600 MG tablet Take 1 tablet (600 mg total) by mouth every 8 (eight) hours as needed. 04/17/19   Pricilla LovelessGoldston, Shakira Los, MD  oxyCODONE-acetaminophen (PERCOCET) 5-325 MG per tablet Take 1 tablet by mouth every 6 (six) hours as needed. 06/30/14   Toy Cookeyocherty, Megan, MD  oxyCODONE-acetaminophen (PERCOCET/ROXICET) 5-325 MG per tablet Take 1-2 tablets by mouth every 6 (six) hours as needed for pain. 10/24/12   Susy FrizzleSheldon, Charles, MD  penicillin v potassium (VEETID) 500 MG tablet Take 1 tablet (500 mg total) by mouth 4 (four) times  daily. 11/05/12   Vanetta MuldersZackowski, Reha Martinovich, MD  traMADol (ULTRAM) 50 MG tablet Take 1 tablet (50 mg total) by mouth every 6 (six) hours as needed. 11/03/14   Lurene ShadowPhelps, Erin O, PA-C    Family History No family history on file.  Social History Social History   Tobacco Use  . Smoking status: Never Smoker  . Smokeless tobacco: Never Used  Substance Use Topics  . Alcohol use: Yes    Comment: occ  . Drug use: No     Allergies   Patient has no known allergies.   Review of Systems Review of Systems  Constitutional: Negative for fever.  Gastrointestinal: Negative for abdominal pain.  Musculoskeletal: Positive for back pain.  Neurological: Negative for weakness and numbness.  All other systems reviewed and are negative.    Physical Exam Updated Vital Signs BP 128/70 (BP Location: Right Arm)   Pulse 66   Temp 98 F (36.7 C) (Oral)   Resp 18   Ht 6' (1.829 m)   Wt 95.7 kg   SpO2 99%   BMI 28.62 kg/m   Physical Exam Vitals signs and nursing note reviewed.  Constitutional:      Appearance: He is well-developed.  HENT:     Head: Normocephalic and atraumatic.     Right Ear: External ear normal.     Left Ear: External ear  normal.     Nose: Nose normal.  Eyes:     General:        Right eye: No discharge.        Left eye: No discharge.  Neck:     Musculoskeletal: Neck supple.  Cardiovascular:     Rate and Rhythm: Normal rate and regular rhythm.     Heart sounds: Normal heart sounds.  Pulmonary:     Effort: Pulmonary effort is normal.     Breath sounds: Normal breath sounds.  Abdominal:     Palpations: Abdomen is soft.     Tenderness: There is no abdominal tenderness.  Musculoskeletal:     Lumbar back: He exhibits tenderness.  Skin:    General: Skin is warm and dry.  Neurological:     Mental Status: He is alert.     Comments: Normal sensation in BLE. Patient's pain is so severe he will not move his legs for strength testing. He states he can move them but they're so  painful.  Psychiatric:        Mood and Affect: Mood is not anxious.      ED Treatments / Results  Labs (all labs ordered are listed, but only abnormal results are displayed) Labs Reviewed - No data to display  EKG None  Radiology No results found.  Procedures Procedures (including critical care time)  Medications Ordered in ED Medications  ondansetron (ZOFRAN-ODT) disintegrating tablet 4 mg (has no administration in time range)  HYDROmorphone (DILAUDID) injection 2 mg (2 mg Intramuscular Given 04/17/19 0956)  ibuprofen (ADVIL) tablet 600 mg (600 mg Oral Given 04/17/19 0957)     Initial Impression / Assessment and Plan / ED Course  I have reviewed the triage vital signs and the nursing notes.  Pertinent labs & imaging results that were available during my care of the patient were reviewed by me and considered in my medical decision making (see chart for details).        Patient's pain has improved with treatment in the ED.  He is able to move his legs for your and does not appear to show any signs of weakness in the exam as equal in strength.  He is feeling nauseated after the Dilaudid and will be given Zofran.  Otherwise, this appears to be an acute on chronic back issue.  My suspicion for an acute spinal cord emergency is very low.  I discussed tricked return precautions but he appears to need outpatient follow-up with his PCP and likely outpatient MRI.  He is requesting ibuprofen for discharge.  Final Clinical Impressions(s) / ED Diagnoses   Final diagnoses:  Strain of lumbar region, initial encounter    ED Discharge Orders         Ordered    ibuprofen (ADVIL) 600 MG tablet  Every 8 hours PRN     04/17/19 1057           Sherwood Gambler, MD 04/17/19 1103

## 2019-04-17 NOTE — ED Notes (Signed)
Pt placed on continuous pulse ox and given cracker and water prior to medication per his request

## 2019-04-17 NOTE — ED Triage Notes (Signed)
Low back pain since picking up his mother this morning.

## 2019-08-26 ENCOUNTER — Other Ambulatory Visit (HOSPITAL_COMMUNITY): Payer: Self-pay | Admitting: Neurosurgery

## 2019-08-26 ENCOUNTER — Other Ambulatory Visit: Payer: Self-pay | Admitting: Neurosurgery

## 2019-08-26 DIAGNOSIS — M5440 Lumbago with sciatica, unspecified side: Secondary | ICD-10-CM

## 2019-09-02 ENCOUNTER — Ambulatory Visit (HOSPITAL_COMMUNITY): Admission: RE | Admit: 2019-09-02 | Payer: BC Managed Care – PPO | Source: Ambulatory Visit

## 2019-09-02 ENCOUNTER — Ambulatory Visit (HOSPITAL_COMMUNITY)
Admission: RE | Admit: 2019-09-02 | Discharge: 2019-09-02 | Disposition: A | Discharge status: Home / Self Care | Payer: BC Managed Care – PPO | Source: Ambulatory Visit | Attending: Neurosurgery | Admitting: Neurosurgery

## 2019-09-02 ENCOUNTER — Encounter (HOSPITAL_COMMUNITY): Payer: Self-pay

## 2019-09-02 DIAGNOSIS — M5440 Lumbago with sciatica, unspecified side: Secondary | ICD-10-CM

## 2021-01-06 ENCOUNTER — Other Ambulatory Visit: Payer: Self-pay

## 2021-01-06 ENCOUNTER — Emergency Department (HOSPITAL_BASED_OUTPATIENT_CLINIC_OR_DEPARTMENT_OTHER)
Admission: EM | Admit: 2021-01-06 | Discharge: 2021-01-06 | Disposition: A | Discharge status: Home / Self Care | Payer: Medicaid Other | Attending: Emergency Medicine | Admitting: Emergency Medicine

## 2021-01-06 ENCOUNTER — Encounter (HOSPITAL_BASED_OUTPATIENT_CLINIC_OR_DEPARTMENT_OTHER): Payer: Self-pay | Admitting: Emergency Medicine

## 2021-01-06 DIAGNOSIS — K4090 Unilateral inguinal hernia, without obstruction or gangrene, not specified as recurrent: Secondary | ICD-10-CM | POA: Insufficient documentation

## 2021-01-06 DIAGNOSIS — R1030 Lower abdominal pain, unspecified: Secondary | ICD-10-CM | POA: Diagnosis present

## 2021-01-06 NOTE — ED Triage Notes (Signed)
Left groin pain started x 2 days ago, denies NV. denies urinary symptoms.

## 2021-01-06 NOTE — Discharge Instructions (Signed)
Take over-the-counter medications as needed for pain.  You can also try wearing hernia truss underwear for support.  Return to the ED for trouble with severe pain, vomiting or hernia that you are not able to reduce

## 2021-01-06 NOTE — ED Notes (Signed)
ED Provider at bedside. 

## 2021-01-06 NOTE — ED Provider Notes (Signed)
[facility] EMERGENCY DEPT Provider Note   CSN: [id number] Arrival date & time: 01/06/21  1150     History Chief Complaint  Patient presents with  . Groin Pain    left    Edwin Berry is a 45 y.o. male.  HPI   Patient presents to the ED for evaluation of left groin pain and swelling.  Patient states that a couple days ago he started having some sharp pain in the left groin area.  Patient has noted some swelling in that same area.  Patient states the swelling comes and goes and mostly there when he is standing.  He has not had any trouble with nausea vomiting.  No fevers or chills.  No urinary symptoms.  Patient thinks he might have a hernia.  History reviewed. No pertinent past medical history.  There are no problems to display for this patient.   History reviewed. No pertinent surgical history.     No family history on file.  Social History   Tobacco Use  . Smoking status: Never Smoker  . Smokeless tobacco: Never Used  Substance Use Topics  . Alcohol use: Yes    Comment: occ  . Drug use: No    Home Medications Prior to Admission medications   Medication Sig Start Date End Date Taking? Authorizing Provider  diazepam (VALIUM) 5 MG tablet Take 1 tablet (5 mg total) by mouth 2 (two) times daily. 11/03/14   Lurene Shadow, PA-C  HYDROcodone-acetaminophen (NORCO/VICODIN) 5-325 MG per tablet Take 1-2 tablets by mouth every 6 (six) hours as needed for pain. 11/05/12   Vanetta Mulders, MD  ibuprofen (ADVIL) 600 MG tablet Take 1 tablet (600 mg total) by mouth every 8 (eight) hours as needed. 04/17/19   Pricilla Loveless, MD  oxyCODONE-acetaminophen (PERCOCET) 5-325 MG per tablet Take 1 tablet by mouth every 6 (six) hours as needed. 06/30/14   Toy Cookey, MD  oxyCODONE-acetaminophen (PERCOCET/ROXICET) 5-325 MG per tablet Take 1-2 tablets by mouth every 6 (six) hours as needed for pain. 10/24/12   Pollyann Savoy, MD  penicillin v potassium (VEETID) 500 MG  tablet Take 1 tablet (500 mg total) by mouth 4 (four) times daily. 11/05/12   Vanetta Mulders, MD  traMADol (ULTRAM) 50 MG tablet Take 1 tablet (50 mg total) by mouth every 6 (six) hours as needed. 11/03/14   Lurene Shadow, PA-C    Allergies    Patient has no known allergies.  Review of Systems   Review of Systems  All other systems reviewed and are negative.   Physical Exam Updated Vital Signs BP (!) 152/100 (BP Location: Right Arm)   Pulse 77   Temp 98.7 F (37.1 C) (Oral)   Resp 16   SpO2 100%   Physical Exam Vitals and nursing note reviewed.  Constitutional:      General: He is not in acute distress.    Appearance: He is well-developed.  HENT:     Head: Normocephalic and atraumatic.     Right Ear: External ear normal.     Left Ear: External ear normal.  Eyes:     General: No scleral icterus.       Right eye: No discharge.        Left eye: No discharge.     Conjunctiva/sclera: Conjunctivae normal.  Neck:     Trachea: No tracheal deviation.  Cardiovascular:     Rate and Rhythm: Normal rate and regular rhythm.  Pulmonary:     Effort:  Pulmonary effort is normal. No respiratory distress.     Breath sounds: Normal breath sounds. No stridor. No wheezing or rales.  Abdominal:     General: Bowel sounds are normal. There is no distension.     Palpations: Abdomen is soft.     Tenderness: There is no abdominal tenderness. There is no guarding or rebound.     Hernia: A hernia is present. Hernia is present in the left inguinal area.  Genitourinary:    Testes:        Right: Mass not present.        Left: Mass not present.     Comments: Left inguinal hernia noticeable when patient is coughing while standing, Musculoskeletal:        General: No tenderness.     Cervical back: Neck supple.  Skin:    General: Skin is warm and dry.     Findings: No rash.  Neurological:     Mental Status: He is alert.     Cranial Nerves: No cranial nerve deficit (no facial droop, extraocular  movements intact, no slurred speech).     Sensory: No sensory deficit.     Motor: No abnormal muscle tone or seizure activity.     Coordination: Coordination normal.     ED Results / Procedures / Treatments   Labs (all labs ordered are listed, but only abnormal results are displayed) Labs Reviewed - No data to display  EKG None  Radiology No results found.  Procedures Procedures   Medications Ordered in ED Medications - No data to display  ED Course  I have reviewed the triage vital signs and the nursing notes.  Pertinent labs & imaging results that were available during my care of the patient were reviewed by me and considered in my medical decision making (see chart for details).    MDM Rules/Calculators/A&P                          Patient appears to have a left inguinal hernia.  He does not have any evidence of incarceration or obstruction.  It is easily reduced.  We will provide a referral to general surgery.  Discussed over-the-counter pain medication use.  Also discussed using a truss as needed Final Clinical Impression(s) / ED Diagnoses Final diagnoses:  Inguinal hernia, left    Rx / DC Orders ED Discharge Orders    None       Linwood Dibbles, MD 01/06/21 1232

## 2021-01-06 NOTE — ED Notes (Signed)
Pt discharged to home. Discharge instructions have been discussed with patient and/or family members. Pt verbally acknowledges understanding d/c instructions, and endorses comprehension to checkout at registration before leaving.  °

## 2021-02-04 ENCOUNTER — Ambulatory Visit: Payer: Self-pay | Admitting: Surgery

## 2021-02-04 DIAGNOSIS — K4020 Bilateral inguinal hernia, without obstruction or gangrene, not specified as recurrent: Secondary | ICD-10-CM | POA: Diagnosis not present

## 2021-02-04 DIAGNOSIS — K429 Umbilical hernia without obstruction or gangrene: Secondary | ICD-10-CM | POA: Diagnosis not present

## 2021-02-04 NOTE — H&P (Signed)
History of Present Illness Edwin Berry. Kristal Perl MD; 02/04/2021 10:31 AM) The patient is a 45 year old male who presents with an inguinal hernia. Referred by Dr. Linwood Dibbles for inguinal hernia  This is a 45 year old male with chronic back issues on disability who presents with a tender bulge in his left groin. His previous job required a lot of heavy lifting. He developed back problems and has been out of work for about a year. He may have to have back surgery in the future to be able to return to work. Over the last several months patient has prone some weight. Recently he noticed a bulge in his left groin that is intermittently fairly uncomfortable. It remains reducible. He was examined and was felt to have a inguinal hernia. The patient also feels a bulge in his right groin but has minimal discomfort associated with that. He denies any obstructive symptoms. He has not had any imaging. He presents now to discuss hernia repair.   Past Surgical History Ezequiel Essex Bond, CMA; 02/04/2021 9:48 AM) No pertinent past surgical history   Diagnostic Studies History (Jocelyn Bond, CMA; 02/04/2021 9:48 AM) Colonoscopy  never  Allergies (Jocelyn Bond, CMA; 02/04/2021 9:47 AM) No Known Drug Allergies  [02/04/2021]: Allergies Reconciled   Medication History (Jocelyn Bond, CMA; 02/04/2021 9:47 AM) No Current Medications Medications Reconciled  Social History (Jocelyn Bond, CMA; 02/04/2021 9:48 AM) Alcohol use  Occasional alcohol use. No caffeine use  No drug use  Tobacco use  Former smoker.  Family History Brendia Sacks, CMA; 02/04/2021 9:48 AM) Diabetes Mellitus  Mother. Heart Disease  Mother. Heart disease in male family member before age 53  Hypertension  Mother. Kidney Disease  Mother.  Other Problems (Jocelyn Bond, CMA; 02/04/2021 9:48 AM) Back Pain  Gastroesophageal Reflux Disease     Review of Systems (Jocelyn Bond CMA; 02/04/2021 9:48 AM) General Not Present- Appetite  Loss, Chills, Fatigue, Fever, Night Sweats, Weight Gain and Weight Loss. Skin Not Present- Change in Wart/Mole, Dryness, Hives, Jaundice, New Lesions, Non-Healing Wounds, Rash and Ulcer. HEENT Not Present- Earache, Hearing Loss, Hoarseness, Nose Bleed, Oral Ulcers, Ringing in the Ears, Seasonal Allergies, Sinus Pain, Sore Throat, Visual Disturbances, Wears glasses/contact lenses and Yellow Eyes. Respiratory Not Present- Bloody sputum, Chronic Cough, Difficulty Breathing, Snoring and Wheezing. Breast Not Present- Breast Mass, Breast Pain, Nipple Discharge and Skin Changes. Cardiovascular Not Present- Chest Pain, Difficulty Breathing Lying Down, Leg Cramps, Palpitations, Rapid Heart Rate, Shortness of Breath and Swelling of Extremities. Gastrointestinal Not Present- Abdominal Pain, Bloating, Bloody Stool, Change in Bowel Habits, Chronic diarrhea, Constipation, Difficulty Swallowing, Excessive gas, Gets full quickly at meals, Hemorrhoids, Indigestion, Nausea, Rectal Pain and Vomiting. Male Genitourinary Not Present- Blood in Urine, Change in Urinary Stream, Frequency, Impotence, Nocturia, Painful Urination, Urgency and Urine Leakage.  Vitals (Jocelyn Bond CMA; 02/04/2021 9:48 AM) 02/04/2021 9:47 AM Weight: 226.25 lb Height: 72in Body Surface Area: 2.25 m Body Mass Index: 30.68 kg/m  Temp.: 98.56F  Pulse: 76 (Regular)  P.OX: 98% (Room air) BP: 120/80(Sitting, Left Arm, Standard)       Physical Exam Molli Hazard K. Kent Braunschweig MD; 02/04/2021 10:31 AM) The physical exam findings are as follows: Note: Constitutional: WDWN in NAD, conversant, no obvious deformities; resting comfortably Eyes: Pupils equal, round; sclera anicteric; moist conjunctiva; no lid lag HENT: Oral mucosa moist; good dentition Neck: No masses palpated, trachea midline; no thyromegaly Lungs: CTA bilaterally; normal respiratory effort CV: Regular rate and rhythm; no murmurs; extremities well-perfused with no edema Abd:  +bowel sounds,  soft, non-tender, no palpable organomegaly; small reducible umbilical hernia GU: Bilateral descended testes, no testicular masses, bilateral inguinal hernias are both reducible. Musc: Normal gait; no apparent clubbing or cyanosis in extremities Lymphatic: No palpable cervical or axillary lymphadenopathy Skin: Warm, dry; no sign of jaundice Psychiatric - alert and oriented x 4; calm mood and affect    Assessment & Plan Molli Hazard K. Dacari Beckstrand MD; 02/04/2021 10:23 AM) BILATERAL INGUINAL HERNIA WITHOUT OBSTRUCTION OR GANGRENE (K40.20) UMBILICAL HERNIA WITHOUT OBSTRUCTION OR GANGRENE (K42.9) Current Plans Schedule for Surgery - Open repair of bilateral inguinal hernias with mesh/ umbilical hernia repair. The surgical procedure has been discussed with the patient. Potential risks, benefits, alternative treatments, and expected outcomes have been explained. All of the patient's questions at this time have been answered. The likelihood of reaching the patient's treatment goal is good. The patient understand the proposed surgical procedure and wishes to proceed.  Edwin Berry. Corliss Skains, MD, [facility] Surgery  General/ Trauma Surgery   02/04/2021 10:35 AM

## 2021-03-23 ENCOUNTER — Other Ambulatory Visit (HOSPITAL_BASED_OUTPATIENT_CLINIC_OR_DEPARTMENT_OTHER): Payer: Self-pay

## 2021-03-23 ENCOUNTER — Encounter: Payer: Self-pay | Admitting: Surgery

## 2021-03-23 DIAGNOSIS — G8918 Other acute postprocedural pain: Secondary | ICD-10-CM | POA: Diagnosis not present

## 2021-03-23 DIAGNOSIS — K429 Umbilical hernia without obstruction or gangrene: Secondary | ICD-10-CM | POA: Diagnosis not present

## 2021-03-23 DIAGNOSIS — K4020 Bilateral inguinal hernia, without obstruction or gangrene, not specified as recurrent: Secondary | ICD-10-CM | POA: Diagnosis not present

## 2021-03-23 MED ORDER — OXYCODONE HCL 5 MG PO TABS
ORAL_TABLET | ORAL | 0 refills | Status: DC
  Filled 2021-03-23: qty 20, 5d supply, fill #0

## 2021-06-06 ENCOUNTER — Emergency Department (HOSPITAL_BASED_OUTPATIENT_CLINIC_OR_DEPARTMENT_OTHER)
Admission: EM | Admit: 2021-06-06 | Discharge: 2021-06-06 | Disposition: A | Discharge status: Home / Self Care | Payer: Medicaid Other | Attending: Emergency Medicine | Admitting: Emergency Medicine

## 2021-06-06 ENCOUNTER — Encounter (HOSPITAL_BASED_OUTPATIENT_CLINIC_OR_DEPARTMENT_OTHER): Payer: Self-pay | Admitting: Emergency Medicine

## 2021-06-06 ENCOUNTER — Other Ambulatory Visit (HOSPITAL_BASED_OUTPATIENT_CLINIC_OR_DEPARTMENT_OTHER): Payer: Self-pay

## 2021-06-06 ENCOUNTER — Other Ambulatory Visit: Payer: Self-pay

## 2021-06-06 DIAGNOSIS — J029 Acute pharyngitis, unspecified: Secondary | ICD-10-CM | POA: Diagnosis not present

## 2021-06-06 DIAGNOSIS — K0889 Other specified disorders of teeth and supporting structures: Secondary | ICD-10-CM | POA: Diagnosis not present

## 2021-06-06 MED ORDER — AMOXICILLIN 500 MG PO CAPS
500.0000 mg | ORAL_CAPSULE | Freq: Two times a day (BID) | ORAL | 0 refills | Status: AC
  Filled 2021-06-06: qty 14, 7d supply, fill #0

## 2021-06-06 NOTE — ED Triage Notes (Signed)
Pt from home c/o a sore throat for the past week and pain in his left jaw from wisdom teeth.  Pt stated that he was around someone with Strep earlier this week.

## 2021-06-06 NOTE — Discharge Instructions (Signed)
If you develop high fever, severe cough or cough with blood, trouble breathing, severe headache, neck pain/stiffness, vomiting, or any other new/concerning symptoms then return to the ER for evaluation  

## 2021-06-06 NOTE — ED Provider Notes (Signed)
[facility] EMERGENCY DEPT Provider Note   CSN: [id number] Arrival date & time: 06/06/21  0709     History Chief Complaint  Patient presents with   Sore Throat    Edwin Berry is a 45 y.o. male.  HPI 46 year old male presents with sore throat and left dental pain.  He states the sore throat is been about a week or so.  He was around someone with strep.  He has not had a fever.  The throat hurts more on the right side.  Maybe a slight cough.  He also has noticed that he has a wisdom tooth coming in on his maxillary molars and he thinks that is cutting on his mandibular molars causing pain and possibly an abscess.  He has taken some ibuprofen.  Pain is moderate.  History reviewed. No pertinent past medical history.  There are no problems to display for this patient.   History reviewed. No pertinent surgical history.     History reviewed. No pertinent family history.  Social History   Tobacco Use   Smoking status: Never   Smokeless tobacco: Never  Substance Use Topics   Alcohol use: Yes    Comment: occ   Drug use: No    Home Medications Prior to Admission medications   Medication Sig Start Date End Date Taking? Authorizing Provider  amoxicillin (AMOXIL) 500 MG capsule Take 1 capsule (500 mg total) by mouth 2 (two) times daily for 7 days. 06/06/21 06/13/21 Yes Pricilla Loveless, MD  diazepam (VALIUM) 5 MG tablet Take 1 tablet (5 mg total) by mouth 2 (two) times daily. 11/03/14   Lurene Shadow, PA-C  HYDROcodone-acetaminophen (NORCO/VICODIN) 5-325 MG per tablet Take 1-2 tablets by mouth every 6 (six) hours as needed for pain. 11/05/12   Vanetta Mulders, MD  ibuprofen (ADVIL) 600 MG tablet Take 1 tablet (600 mg total) by mouth every 8 (eight) hours as needed. 04/17/19   Pricilla Loveless, MD  oxyCODONE-acetaminophen (PERCOCET) 5-325 MG per tablet Take 1 tablet by mouth every 6 (six) hours as needed. 06/30/14   Toy Cookey, MD  oxyCODONE-acetaminophen  (PERCOCET/ROXICET) 5-325 MG per tablet Take 1-2 tablets by mouth every 6 (six) hours as needed for pain. 10/24/12   Pollyann Savoy, MD  traMADol (ULTRAM) 50 MG tablet Take 1 tablet (50 mg total) by mouth every 6 (six) hours as needed. 11/03/14   Lurene Shadow, PA-C    Allergies    Patient has no known allergies.  Review of Systems   Review of Systems  Constitutional:  Negative for fever.  HENT:  Positive for dental problem and sore throat.   Respiratory:  Positive for cough.    Physical Exam Updated Vital Signs BP (!) 125/101 (BP Location: Right Arm)   Pulse 80   Temp 97.9 F (36.6 C) (Oral)   Resp 16   Ht 6' (1.829 m)   Wt 99.8 kg   SpO2 99%   BMI 29.84 kg/m   Physical Exam Vitals and nursing note reviewed.  Constitutional:      General: He is not in acute distress.    Appearance: He is well-developed. He is not ill-appearing or diaphoretic.  HENT:     Head: Normocephalic and atraumatic.     Right Ear: External ear normal.     Left Ear: External ear normal.     Nose: Nose normal.     Mouth/Throat:     Mouth: No oral lesions.     Pharynx: Uvula midline.  No pharyngeal swelling, oropharyngeal exudate, posterior oropharyngeal erythema or uvula swelling.      Comments: There are couple missing mandibular teeth.  There is no obvious abscess. Eyes:     General:        Right eye: No discharge.        Left eye: No discharge.  Cardiovascular:     Rate and Rhythm: Normal rate and regular rhythm.     Heart sounds: Normal heart sounds.  Pulmonary:     Effort: Pulmonary effort is normal.     Breath sounds: Normal breath sounds.  Abdominal:     Palpations: Abdomen is soft.     Tenderness: There is no abdominal tenderness.  Musculoskeletal:     Cervical back: Neck supple.  Skin:    General: Skin is warm and dry.  Neurological:     Mental Status: He is alert.  Psychiatric:        Mood and Affect: Mood is not anxious.    ED Results / Procedures / Treatments    Labs (all labs ordered are listed, but only abnormal results are displayed) Labs Reviewed - No data to display  EKG None  Radiology No results found.  Procedures Procedures   Medications Ordered in ED Medications - No data to display  ED Course  I have reviewed the triage vital signs and the nursing notes.  Pertinent labs & imaging results that were available during my care of the patient were reviewed by me and considered in my medical decision making (see chart for details).    MDM Rules/Calculators/A&P                           Patient is well-appearing.  There is no sign of a deep space mouth or oropharyngeal infection.  My suspicion is he probably does not have strep given age, no fever, etc.  However with acute dental pain there could be an infectious/inflammatory component and so we will put on amoxicillin and refer him to a dentist.  Will discharge home with return precautions but he is otherwise well-appearing. Final Clinical Impression(s) / ED Diagnoses Final diagnoses:  Pharyngitis, unspecified etiology  Pain, dental    Rx / DC Orders ED Discharge Orders          Ordered    amoxicillin (AMOXIL) 500 MG capsule  2 times daily        06/06/21 0735             Pricilla Loveless, MD 06/06/21 [phone]

## 2021-06-07 ENCOUNTER — Telehealth: Payer: Self-pay

## 2021-06-07 NOTE — Telephone Encounter (Signed)
Transition Care Management Follow-up Telephone Call Date of discharge and from where: 06/06/2021-[facility] How have you been since you were released from the hospital? Patient stated he is doing better. Any questions or concerns? No  Items Reviewed: Did the pt receive and understand the discharge instructions provided? Yes  Medications obtained and verified? Yes  Other? No  Any new allergies since your discharge? No  Dietary orders reviewed? N/A Do you have support at home? Yes   Home Care and Equipment/Supplies: Were home health services ordered? not applicable If so, what is the name of the agency? N/A  Has the agency set up a time to come to the patient's home? not applicable Were any new equipment or medical supplies ordered?  No What is the name of the medical supply agency? N/A Were you able to get the supplies/equipment? not applicable Do you have any questions related to the use of the equipment or supplies? No  Functional Questionnaire: (I = Independent and D = Dependent) ADLs: I  Bathing/Dressing- I  Meal Prep- I  Eating- I  Maintaining continence- I  Transferring/Ambulation- I  Managing Meds- I  Follow up appointments reviewed:  PCP Hospital f/u appt confirmed? No   Specialist Hospital f/u appt confirmed? No  . Are transportation arrangements needed? No  If their condition worsens, is the pt aware to call PCP or go to the Emergency Dept.? Yes Was the patient provided with contact information for the PCP's office or ED? Yes Was to pt encouraged to call back with questions or concerns? Yes

## 2021-08-23 ENCOUNTER — Encounter (HOSPITAL_BASED_OUTPATIENT_CLINIC_OR_DEPARTMENT_OTHER): Payer: Self-pay | Admitting: *Deleted

## 2021-08-23 ENCOUNTER — Other Ambulatory Visit (HOSPITAL_BASED_OUTPATIENT_CLINIC_OR_DEPARTMENT_OTHER): Payer: Self-pay

## 2021-08-23 ENCOUNTER — Other Ambulatory Visit: Payer: Self-pay

## 2021-08-23 ENCOUNTER — Emergency Department (HOSPITAL_BASED_OUTPATIENT_CLINIC_OR_DEPARTMENT_OTHER)
Admission: EM | Admit: 2021-08-23 | Discharge: 2021-08-23 | Disposition: A | Discharge status: Home / Self Care | Payer: Medicaid Other | Attending: Emergency Medicine | Admitting: Emergency Medicine

## 2021-08-23 DIAGNOSIS — R509 Fever, unspecified: Secondary | ICD-10-CM | POA: Diagnosis present

## 2021-08-23 DIAGNOSIS — Z20822 Contact with and (suspected) exposure to covid-19: Secondary | ICD-10-CM | POA: Insufficient documentation

## 2021-08-23 DIAGNOSIS — J101 Influenza due to other identified influenza virus with other respiratory manifestations: Secondary | ICD-10-CM | POA: Insufficient documentation

## 2021-08-23 LAB — RESP PANEL BY RT-PCR (FLU A&B, COVID) ARPGX2
Influenza A by PCR: POSITIVE — AB
Influenza B by PCR: NEGATIVE
SARS Coronavirus 2 by RT PCR: NEGATIVE

## 2021-08-23 LAB — GROUP A STREP BY PCR: Group A Strep by PCR: NOT DETECTED

## 2021-08-23 MED ORDER — IBUPROFEN 800 MG PO TABS
800.0000 mg | ORAL_TABLET | Freq: Three times a day (TID) | ORAL | 0 refills | Status: AC
  Filled 2021-08-23: qty 21, 7d supply, fill #0

## 2021-08-23 NOTE — Discharge Instructions (Addendum)
I have prescribed ibuprofen to help with your fever, please take this up to 3 times a day for fever control.   Continue to hydrate with plenty of fluids. If you experience any chest pain, shortness of breath or worsening symptoms you may return to the ED.

## 2021-08-23 NOTE — ED Provider Notes (Signed)
[facility] EMERGENCY DEPT Provider Note   CSN: [id number] Arrival date & time: 08/23/21  1135     History Chief Complaint  Patient presents with   Sore Throat    Edwin Berry is a 45 y.o. male.  45 y.o male with no PMH presents to the ED with a chief complaint of sore throat x 3 days. Patient describes his symptoms as congestion, fever, sore throat along with body aches. He has been taking ibuprofen with improvement on his fever. Tmax of 102.7 at home. He also describes worsening pain while swallowing. No chest pain, no shortness of breath, non smoker.   The history is provided by the patient and medical records.  Sore Throat This is a new problem. The current episode started more than 2 days ago. The problem occurs constantly. The problem has not changed since onset.Pertinent negatives include no chest pain and no shortness of breath.       Past Surgical History:  Procedure Laterality Date   HERNIA REPAIR         No family history on file.  Social History   Tobacco Use   Smoking status: Never   Smokeless tobacco: Never  Vaping Use   Vaping Use: Never used  Substance Use Topics   Alcohol use: Not Currently    Comment: occ   Drug use: No    Home Medications Prior to Admission medications   Medication Sig Start Date End Date Taking? Authorizing Provider  ibuprofen (ADVIL) 800 MG tablet Take 1 tablet (800 mg total) by mouth 3 (three) times daily for 7 days. 08/23/21 08/30/21 Yes Kristalyn Bergstresser, PA-C  diazepam (VALIUM) 5 MG tablet Take 1 tablet (5 mg total) by mouth 2 (two) times daily. 11/03/14   Lurene Shadow, PA-C  HYDROcodone-acetaminophen (NORCO/VICODIN) 5-325 MG per tablet Take 1-2 tablets by mouth every 6 (six) hours as needed for pain. 11/05/12   Vanetta Mulders, MD  oxyCODONE-acetaminophen (PERCOCET) 5-325 MG per tablet Take 1 tablet by mouth every 6 (six) hours as needed. 06/30/14   Toy Cookey, MD  oxyCODONE-acetaminophen  (PERCOCET/ROXICET) 5-325 MG per tablet Take 1-2 tablets by mouth every 6 (six) hours as needed for pain. 10/24/12   Pollyann Savoy, MD  traMADol (ULTRAM) 50 MG tablet Take 1 tablet (50 mg total) by mouth every 6 (six) hours as needed. 11/03/14   Lurene Shadow, PA-C    Allergies    Patient has no known allergies.  Review of Systems   Review of Systems  Constitutional:  Positive for fever. Negative for chills.  HENT:  Positive for rhinorrhea, sinus pressure and sore throat.   Respiratory:  Negative for shortness of breath.   Cardiovascular:  Negative for chest pain.   Physical Exam Updated Vital Signs BP (!) 153/111 (BP Location: Right Arm)   Pulse 67   Temp 98.4 F (36.9 C)   Resp 16   Ht 6' (1.829 m)   Wt 97.5 kg   SpO2 100%   BMI 29.16 kg/m   Physical Exam Vitals and nursing note reviewed.  Constitutional:      Appearance: He is well-developed. He is not ill-appearing.  HENT:     Head: Normocephalic and atraumatic.     Nose: Congestion present.     Mouth/Throat:     Mouth: Mucous membranes are moist.     Tonsils: No tonsillar exudate or tonsillar abscesses.     Comments: No tonsillar exudates, no PTA. Uvula is midline.  Cardiovascular:  Rate and Rhythm: Normal rate.  Pulmonary:     Effort: Pulmonary effort is normal.     Breath sounds: No wheezing.     Comments: Lungs are clear to ascultation without any wheezing, rales or rhonchi.  Abdominal:     Palpations: Abdomen is soft.  Skin:    General: Skin is warm and dry.  Neurological:     Mental Status: He is alert and oriented to person, place, and time.    ED Results / Procedures / Treatments   Labs (all labs ordered are listed, but only abnormal results are displayed) Labs Reviewed  RESP PANEL BY RT-PCR (FLU A&B, COVID) ARPGX2 - Abnormal; Notable for the following components:      Result Value   Influenza A by PCR POSITIVE (*)    All other components within normal limits  GROUP A STREP BY PCR     EKG None  Radiology No results found.  Procedures Procedures   Medications Ordered in ED Medications - No data to display  ED Course  I have reviewed the triage vital signs and the nursing notes.  Pertinent labs & imaging results that were available during my care of the patient were reviewed by me and considered in my medical decision making (see chart for details).    MDM Rules/Calculators/A&P   Patient presents to the ED with 3 days of URI symptoms such as congestion, sore throat, fevers with a T-max of 1-2.7, treated with ibuprofen prior to arrival.  Arrives in the ED with stable vital signs aside from slight elevation in his blood pressure.  Afebrile, no hypoxia, no tachycardia.  No prior history of asthma, non-smoker.  Exam is benign, lungs are clear to auscultation, oropharynx is erythematous without any tonsillar exudate or PTA noted.  Rapid test for influenza is positive on today's visit.  We discussed symptomatic treatment at home along with follow-up with PCP or worsening symptoms may return to the ED.  He is agreeable with plan and treatment at this time.  He is out of Tamiflu window as symptoms have been ongoing for 3 days.  He is agreeable plan and treatment, nontoxic-appearing stable for discharge.  Portions of this note were generated with Scientist, clinical (histocompatibility and immunogenetics). Dictation errors may occur despite best attempts at proofreading.  Final Clinical Impression(s) / ED Diagnoses Final diagnoses:  Influenza A    Rx / DC Orders ED Discharge Orders          Ordered    ibuprofen (ADVIL) 800 MG tablet  3 times daily        08/23/21 1525             Claude Manges, PA-C 08/23/21 1527    Tanda Rockers A, DO 08/24/21 1640

## 2021-08-23 NOTE — ED Triage Notes (Signed)
Pt with sore throat, fever, body aches, symptoms relived some with Ibuprofen. Symptoms x 3 days.

## 2021-08-24 ENCOUNTER — Telehealth: Payer: Self-pay

## 2021-08-24 NOTE — Telephone Encounter (Signed)
Transition Care Management Unsuccessful Follow-up Telephone Call  Date of discharge and from where:  08/23/2021-[facility]  Attempts:  1st Attempt  Reason for unsuccessful TCM follow-up call:  Left voice message

## 2021-08-25 NOTE — Telephone Encounter (Signed)
Transition Care Management Unsuccessful Follow-up Telephone Call  Date of discharge and from where:  08/23/2021 from [facility]  Attempts:  2nd Attempt  Reason for unsuccessful TCM follow-up call:  Left voice message

## 2021-08-26 NOTE — Telephone Encounter (Signed)
Transition Care Management Unsuccessful Follow-up Telephone Call  Date of discharge and from where:  08/23/2021-[facility]  Attempts:  3rd Attempt  Reason for unsuccessful TCM follow-up call:  Left voice message

## 2022-01-16 ENCOUNTER — Emergency Department (HOSPITAL_BASED_OUTPATIENT_CLINIC_OR_DEPARTMENT_OTHER)
Admission: EM | Admit: 2022-01-16 | Discharge: 2022-01-16 | Disposition: A | Discharge status: Home / Self Care | Payer: Medicaid Other | Attending: Emergency Medicine | Admitting: Emergency Medicine

## 2022-01-16 ENCOUNTER — Other Ambulatory Visit (HOSPITAL_BASED_OUTPATIENT_CLINIC_OR_DEPARTMENT_OTHER): Payer: Self-pay

## 2022-01-16 ENCOUNTER — Other Ambulatory Visit: Payer: Self-pay

## 2022-01-16 ENCOUNTER — Encounter (HOSPITAL_BASED_OUTPATIENT_CLINIC_OR_DEPARTMENT_OTHER): Payer: Self-pay

## 2022-01-16 DIAGNOSIS — R319 Hematuria, unspecified: Secondary | ICD-10-CM | POA: Diagnosis present

## 2022-01-16 DIAGNOSIS — N3001 Acute cystitis with hematuria: Secondary | ICD-10-CM | POA: Diagnosis not present

## 2022-01-16 LAB — URINALYSIS, ROUTINE W REFLEX MICROSCOPIC
Bilirubin Urine: NEGATIVE
Glucose, UA: NEGATIVE mg/dL
Ketones, ur: NEGATIVE mg/dL
Nitrite: NEGATIVE
Protein, ur: 30 mg/dL — AB
Specific Gravity, Urine: 1.023 (ref 1.005–1.030)
pH: 5.5 (ref 5.0–8.0)

## 2022-01-16 MED ORDER — CEPHALEXIN 500 MG PO CAPS
500.0000 mg | ORAL_CAPSULE | Freq: Four times a day (QID) | ORAL | 0 refills | Status: AC
  Filled 2022-01-16: qty 28, 7d supply, fill #0

## 2022-01-16 NOTE — ED Triage Notes (Signed)
Pt presents with dysuria, hematuria, urinary frequency, and malodorous urine. Symptoms started a couple of days ago and worse today. Denies fevers.  ?

## 2022-01-16 NOTE — ED Provider Notes (Signed)
?[facility] EMERGENCY DEPT ?Provider Note ? ? ?CSN: [id number] ?Arrival date & time: 01/16/22  1442 ? ?  ? ?History ? ?Chief Complaint  ?Patient presents with  ? Hematuria  ? ? ?Edwin Berry is a 46 y.o. male who presents today for evaluation of hematuria. ?Reports that about 3 to 4 days ago he had an episode where he urinated again and it smelled very foul.  He then noticed today he had blood in his urine prompting him to present.  He denies any pain in his back.  No history of kidney stones.  No fevers, nausea vomiting or diarrhea.  He denies any penile discharge or testicular pain.  No rectal pain or pain with bowel movements.  No abdominal pain. ? ?He denies any concern for STI, no genital lesions or sores. ? ?HPI ? ?  ? ?Home Medications ?Prior to Admission medications   ?Medication Sig Start Date End Date Taking? Authorizing Provider  ?cephALEXin (KEFLEX) 500 MG capsule Take 1 capsule (500 mg total) by mouth 4 (four) times daily for 7 days. 01/16/22 01/23/22 Yes Cristina Gong, PA-C  ?diazepam (VALIUM) 5 MG tablet Take 1 tablet (5 mg total) by mouth 2 (two) times daily. 11/03/14   Lurene Shadow, PA-C  ?HYDROcodone-acetaminophen (NORCO/VICODIN) 5-325 MG per tablet Take 1-2 tablets by mouth every 6 (six) hours as needed for pain. 11/05/12   Vanetta Mulders, MD  ?oxyCODONE-acetaminophen (PERCOCET) 5-325 MG per tablet Take 1 tablet by mouth every 6 (six) hours as needed. 06/30/14   Toy Cookey, MD  ?oxyCODONE-acetaminophen (PERCOCET/ROXICET) 5-325 MG per tablet Take 1-2 tablets by mouth every 6 (six) hours as needed for pain. 10/24/12   Pollyann Savoy, MD  ?traMADol (ULTRAM) 50 MG tablet Take 1 tablet (50 mg total) by mouth every 6 (six) hours as needed. 11/03/14   Lurene Shadow, PA-C  ?   ? ?Allergies    ?Patient has no known allergies.   ? ?Review of Systems   ?Review of Systems ? ?See above ? ?Physical Exam ?Updated Vital Signs ?BP (!) 147/95 (BP Location: Right Arm)   Pulse 78   Temp  99.3 ?F (37.4 ?C) (Oral)   Resp 15   Ht 6' (1.829 m)   Wt 97.5 kg   SpO2 100%   BMI 29.16 kg/m?  ?Physical Exam ?Vitals and nursing note reviewed.  ?Constitutional:   ?   General: He is not in acute distress. ?   Appearance: He is not ill-appearing.  ?HENT:  ?   Head: Atraumatic.  ?Eyes:  ?   Conjunctiva/sclera: Conjunctivae normal.  ?Cardiovascular:  ?   Rate and Rhythm: Normal rate.  ?Pulmonary:  ?   Effort: Pulmonary effort is normal. No respiratory distress.  ?Abdominal:  ?   General: There is no distension.  ?   Tenderness: There is no abdominal tenderness. There is no right CVA tenderness, left CVA tenderness or guarding.  ?Genitourinary: ?   Comments: deferred ?Musculoskeletal:  ?   Cervical back: Normal range of motion and neck supple.  ?   Comments: No obvious acute injury  ?Skin: ?   General: Skin is warm.  ?Neurological:  ?   Mental Status: He is alert.  ?   Comments: Awake and alert, answers all questions appropriately.  Speech is not slurred.  ?Psychiatric:     ?   Mood and Affect: Mood normal.     ?   Behavior: Behavior normal.  ? ? ?ED Results /  Procedures / Treatments   ?Labs ?(all labs ordered are listed, but only abnormal results are displayed) ?Labs Reviewed  ?URINE CULTURE - Abnormal; Notable for the following components:  ?    Result Value  ? Culture   (*)   ? Value: 40,000 COLONIES/mL ESCHERICHIA COLI ?SUSCEPTIBILITIES TO FOLLOW ?Performed at [facility], [address] [place], [place] [address] ?  ? All other components within normal limits  ?URINALYSIS, ROUTINE W REFLEX MICROSCOPIC - Abnormal; Notable for the following components:  ? APPearance HAZY (*)   ? Hgb urine dipstick LARGE (*)   ? Protein, ur 30 (*)   ? Leukocytes,Ua LARGE (*)   ? Bacteria, UA RARE (*)   ? Non Squamous Epithelial 0-5 (*)   ? All other components within normal limits  ?GC/CHLAMYDIA PROBE AMP ([place]) NOT AT [facility]  ? ? ?EKG ?None ? ?Radiology ?No results found. ? ?Procedures ?Procedures   ? ? ?Medications Ordered in ED ?Medications - No data to display ? ?ED Course/ Medical Decision Making/ A&P ?  ?                        ?Medical Decision Making ?Patient is a 46 year old male who presents today for evaluation of dysuria, hematuria, and foul urinary odors. ?He does not have a history of similar.  No back or abdominal pain and his symptoms do not sound consistent with renal stone. ?He is afebrile, not tachycardic or tachypneic and generally well-appearing. ?He denies concern for STI including no testicular or rectal pain.  No genital lesions or sores. ?UA obtained and reviewed, please see below. ?We will send urine culture along with GC testing. ?We will treat with Keflex. ? ?Return precautions were discussed with patient who states their understanding.  At the time of discharge patient denied any unaddressed complaints or concerns.  Patient is agreeable for discharge home. ? ?Note: Portions of this report may have been transcribed using voice recognition software. Every effort was made to ensure accuracy; however, inadvertent computerized transcription errors may be present ? ? ? ?Amount and/or Complexity of Data Reviewed ?Labs: ordered. ?   Details: UA with large blood and large leukocytes, microscopy shows 11-20 reds and whites, rare bacteria. ? ?Risk ?Prescription drug management. ?Decision regarding hospitalization. ? ? ? ? ? ? ? ? ? ? ?Final Clinical Impression(s) / ED Diagnoses ?Final diagnoses:  ?Acute cystitis with hematuria  ? ? ?Rx / DC Orders ?ED Discharge Orders   ? ?      Ordered  ?  cephALEXin (KEFLEX) 500 MG capsule  4 times daily       ? 01/16/22 1659  ? ?  ?  ? ?  ? ? ?  ?Cristina Gong, [place] ?01/17/22 1509 ? ?  ?Terald Sleeper, MD ?01/17/22 1537 ? ?

## 2022-01-16 NOTE — Discharge Instructions (Addendum)
You may have diarrhea from the antibiotics.  It is very important that you continue to take the antibiotics even if you get diarrhea unless a medical professional tells you that you may stop taking them.  If you stop too early the bacteria you are being treated for will become stronger and you may need different, more powerful antibiotics that have more side effects and worsening diarrhea.  Please stay well hydrated and consider probiotics as they may decrease the severity of your diarrhea.   °

## 2022-01-17 ENCOUNTER — Telehealth: Payer: Self-pay

## 2022-01-17 NOTE — Telephone Encounter (Signed)
Transition Care Management Unsuccessful Follow-up Telephone Call ? ?Date of discharge and from where:  01/16/2022 from [facility] ? ?Attempts:  1st Attempt ? ?Reason for unsuccessful TCM follow-up call:  Left voice message ? ? ? ?

## 2022-01-18 LAB — URINE CULTURE: Culture: 40000 — AB

## 2022-01-18 NOTE — Telephone Encounter (Signed)
Transition Care Management Unsuccessful Follow-up Telephone Call ? ?Date of discharge and from where:  01/16/2022 from [place] ? ?Attempts:  2nd Attempt ? ?Reason for unsuccessful TCM follow-up call:  Left voice message ? ? ? ?

## 2022-01-18 NOTE — ED Notes (Signed)
Pt called regarding two missed phone calls from [place]. He is concerned if he is on the correct antibiotics ? ?

## 2022-01-19 ENCOUNTER — Telehealth: Payer: Self-pay | Admitting: *Deleted

## 2022-01-19 NOTE — Telephone Encounter (Signed)
Post ED Visit - Positive Culture Follow-up ? ?Culture report reviewed by antimicrobial stewardship pharmacist: ?[facility] ?[]  , Enzo Bi.D. ?[]  [address], Pharm.D., BCPS AQ-ID ?[]  , Pharm.D., BCPS ?[]  Celedonio Miyamoto, Pharm.D., BCPS ?[]  [place], Garvin Fila.D., BCPS, AAHIVP ?[]  , Pharm.D., BCPS, AAHIVP ?[]  Georgina Pillion, PharmD, BCPS ?[]  , PharmD, BCPS ?[]  [place], PharmD, BCPS ?[]  [address], PharmD ?[]  , PharmD, BCPS ?[]  Estella Husk, PharmD ? ? [facility] ?[]  Lysle Pearl, PharmD ?[]  , PharmD ?[]  Phillips Climes, PharmD ?[]  , Rph ?[]  Agapito Games) , PharmD ?[]  Verlan Friends, PharmD ?[]  , PharmD ?[]  Mervyn Gay, PharmD ?[]  , PharmD ?[]  Vinnie Level, PharmD ?[]  Gerri Spore, PharmD ?[]  , PharmD ?[]  Len Childs, PharmD ? ? ?Positive urine culture ?Treated with Cephalexin, organism sensitive to the same and no further patient follow-up is required at this time.  , PharmD ? ?Greer Pickerel ?01/19/2022, 7:50 AM ?  ?

## 2022-01-19 NOTE — Telephone Encounter (Signed)
Transition Care Management Follow-up Telephone Call ?Date of discharge and from where: 01/16/2022 from [facility] ?How have you been since you were released from the hospital? Patient stated that he is feeling better and has started his abx. No adverse reaction to taking the abx.  ?Any questions or concerns? No ? ?Items Reviewed: ?Did the pt receive and understand the discharge instructions provided? Yes  ?Medications obtained and verified? Yes  ?Other? No  ?Any new allergies since your discharge? No  ?Dietary orders reviewed? No ?Do you have support at home? Yes  ? ?Functional Questionnaire: (I = Independent and D = Dependent) ?ADLs: I ? ?Bathing/Dressing- I ? ?Meal Prep- I ? ?Eating- I ? ?Maintaining continence- I ? ?Transferring/Ambulation- I ? ?Managing Meds- I ? ? ?Follow up appointments reviewed: ? ?PCP Hospital f/u appt confirmed? No   ?Specialist Hospital f/u appt confirmed? No   ?Are transportation arrangements needed? No  ?If their condition worsens, is the pt aware to call PCP or go to the Emergency Dept.? Yes ?Was the patient provided with contact information for the PCP's office or ED? Yes ?Was to pt encouraged to call back with questions or concerns? Yes ? ?

## 2022-01-20 ENCOUNTER — Telehealth: Payer: Self-pay | Admitting: *Deleted

## 2022-01-20 NOTE — Telephone Encounter (Signed)
Post ED Visit - Positive Culture Follow-up ? ?Culture report reviewed by antimicrobial stewardship pharmacist: ?[facility] ?[] Nathan Batchelder, Pharm.D. ?[] Jeremy Frens, Pharm.D., BCPS AQ-ID ?[] Mike Maccia, Pharm.D., BCPS ?[] Elizabeth Martin, Pharm.D., BCPS ?[] Minh Pham, Pharm.D., BCPS, AAHIVP ?[] Michelle Turner, Pharm.D., BCPS, AAHIVP ?[] Rachel Rumbarger, PharmD, BCPS ?[] Thuy Dang, PharmD, BCPS ?[] Alison Masters, PharmD, BCPS ?[] Erin Deja, PharmD ?[] Catherine Pierce, PharmD, BCPS ?[] Benjamin Mancheril, PharmD ? ?[facility] ?[] Michelle Bell, PharmD ?[] Jigna Gadhia, PharmD ?[] Nikola Glogovac, PharmD ?[] Terri Green, Rph ?[] Rachel (Ellen) Jackson, PharmD ?[] Justin Legge, PharmD ?[] Michelle Lilliston, PharmD ?[] Anh Pham, PharmD ?[] Leann Poindexter, PharmD ?[] Christine Shade, PharmD ?[] Mary Swayne, PharmD ?[] Erin Williamson, PharmD ?[] Drew Wofford, PharmD ? ? ?Positive urine culture ?Treated with Cephalexin, organism sensitive to the same and no further patient follow-up is required at this time.  A. Lawless, PharmD ? ?Edwin Berry ?01/20/2022, 7:51 AM ?  ?

## 2023-11-02 ENCOUNTER — Telehealth (HOSPITAL_BASED_OUTPATIENT_CLINIC_OR_DEPARTMENT_OTHER): Payer: Self-pay | Admitting: Emergency Medicine

## 2023-11-02 ENCOUNTER — Encounter (HOSPITAL_BASED_OUTPATIENT_CLINIC_OR_DEPARTMENT_OTHER): Payer: Self-pay | Admitting: Emergency Medicine

## 2023-11-02 ENCOUNTER — Emergency Department (HOSPITAL_BASED_OUTPATIENT_CLINIC_OR_DEPARTMENT_OTHER): Payer: Medicaid Other

## 2023-11-02 ENCOUNTER — Other Ambulatory Visit (HOSPITAL_BASED_OUTPATIENT_CLINIC_OR_DEPARTMENT_OTHER): Payer: Self-pay

## 2023-11-02 ENCOUNTER — Other Ambulatory Visit: Payer: Self-pay

## 2023-11-02 ENCOUNTER — Emergency Department (HOSPITAL_BASED_OUTPATIENT_CLINIC_OR_DEPARTMENT_OTHER)
Admission: EM | Admit: 2023-11-02 | Discharge: 2023-11-02 | Disposition: A | Discharge status: Home / Self Care | Payer: Medicaid Other

## 2023-11-02 DIAGNOSIS — B974 Respiratory syncytial virus as the cause of diseases classified elsewhere: Secondary | ICD-10-CM | POA: Insufficient documentation

## 2023-11-02 DIAGNOSIS — R059 Cough, unspecified: Secondary | ICD-10-CM | POA: Insufficient documentation

## 2023-11-02 DIAGNOSIS — I159 Secondary hypertension, unspecified: Secondary | ICD-10-CM | POA: Insufficient documentation

## 2023-11-02 DIAGNOSIS — B338 Other specified viral diseases: Secondary | ICD-10-CM

## 2023-11-02 LAB — RESP PANEL BY RT-PCR (RSV, FLU A&B, COVID)  RVPGX2
Influenza A by PCR: NEGATIVE
Influenza B by PCR: NEGATIVE
Resp Syncytial Virus by PCR: POSITIVE — AB
SARS Coronavirus 2 by RT PCR: NEGATIVE

## 2023-11-02 MED ORDER — IBUPROFEN 800 MG PO TABS: 800.0000 mg | ORAL_TABLET | Freq: Three times a day (TID) | ORAL | 0 refills | Status: AC

## 2023-11-02 MED ORDER — IBUPROFEN 800 MG PO TABS
800.0000 mg | ORAL_TABLET | Freq: Three times a day (TID) | ORAL | 0 refills | Status: DC
  Filled 2023-11-02: qty 21, 7d supply, fill #0

## 2023-11-02 NOTE — ED Notes (Signed)
Reviewed discharge instructions and follow up. Information with for RSV provided in AVS. Pt states understanding

## 2023-11-02 NOTE — Discharge Instructions (Addendum)
You have RSV infection. Your X-ray did not show a pnuemonia. RSV is a virus and should resolve with tim.  Treatment is supportive. You may take over-the-counter Tylenol alternating with ibuprofen for generalized pain and fevers.  RSV infections typically last 7 to 10 days, and you may experience prolonged cough up to 3 to 4 weeks.  You may take over the counter medications such as Mucinex to help with your symptoms as well.

## 2023-11-02 NOTE — ED Provider Notes (Signed)
[place] EMERGENCY DEPARTMENT AT [facility] Provider Note   CSN: [id number] Arrival date & time: 11/02/23  0725     History  Chief Complaint  Patient presents with   URI    Edwin Berry is a 48 y.o. male.  48 year old male presenting emergency department for congestion, rhinorrhea and cough.  Symptoms started yesterday.  He has noted some mild dyspnea on exertion.  No history of lung problems.  Does smoke.  Cough has been productive, clear sputum.  He notes rattling sound with breathing.   URI      Home Medications Prior to Admission medications   Not on File      Allergies    Patient has no known allergies.    Review of Systems   Review of Systems  Physical Exam Updated Vital Signs BP (!) 161/117   Pulse 71   Temp 98.1 F (36.7 C) (Oral)   Resp 16   Ht 6' (1.829 m)   Wt 95.3 kg   SpO2 99%   BMI 28.48 kg/m  Physical Exam Vitals and nursing note reviewed.  Constitutional:      General: He is not in acute distress.    Appearance: He is not toxic-appearing.  HENT:     Nose: Congestion present.     Mouth/Throat:     Mouth: Mucous membranes are moist.  Pulmonary:     Effort: Pulmonary effort is normal.     Breath sounds: Rhonchi present.  Abdominal:     General: Abdomen is flat. There is no distension.  Musculoskeletal:        General: Normal range of motion.     Cervical back: Normal range of motion.  Skin:    General: Skin is warm and dry.     Capillary Refill: Capillary refill takes less than 2 seconds.  Neurological:     Mental Status: He is alert and oriented to person, place, and time.  Psychiatric:        Mood and Affect: Mood normal.        Behavior: Behavior normal.     ED Results / Procedures / Treatments   Labs (all labs ordered are listed, but only abnormal results are displayed) Labs Reviewed  RESP PANEL BY RT-PCR (RSV, FLU A&B, COVID)  RVPGX2 - Abnormal; Notable for the following components:      Result Value    Resp Syncytial Virus by PCR POSITIVE (*)    All other components within normal limits    EKG None  Radiology DG Chest 2 View Result Date: 11/02/2023 CLINICAL DATA:  Cough. EXAM: CHEST - 2 VIEW COMPARISON:  Chest radiograph dated 05/16/2008. FINDINGS: The heart size and mediastinal contours are within normal limits. No focal consolidation, pleural effusion, or pneumothorax. No acute osseous abnormality. IMPRESSION: No acute cardiopulmonary findings. Electronically Signed   By: Hart Robinsons M.D.   On: 11/02/2023 09:53    Procedures Procedures    Medications Ordered in ED Medications - No data to display  ED Course/ Medical Decision Making/ A&P Clinical Course as of 11/02/23 1253  Fri Nov 02, 2023  0851 Respiratory Syncytial Virus by PCR(!): POSITIVE [TY]  0851 DG Chest 2 View No overt pneumonia on my independent interpretation. [TY]  0957 DG Chest 2 View IMPRESSION: No acute cardiopulmonary findings.   [TY]  1253 Symptoms likely second RSV.  Not in respiratory distress, maintaining oxygen saturation on room air.  X-ray negative for pneumonia.  Discussed supportive care.  Stable  for discharge. [TY]    Clinical Course User Index [TY] Coral Spikes, DO                                 Medical Decision Making Well-appearing 48 year old male presenting emergency department for URI symptoms.  He is afebrile nontachycardic.  Hypertension noted.  Reports he has been taking his blood pressure at home and has been reading similar.  His symptoms are concerning for possible viral etiology given the prevalence of influenza and COVID at this time we will swab him for virus.  He does have asymmetric rhonchi.  Will get chest x-ray to evaluate for pneumonia.  Patient overall clinically well-appearing, low suspicion for systemic infection.  Will forego labs at this time.  See ED course for final MDM/disposition.  Amount and/or Complexity of Data Reviewed External Data Reviewed:      Details: Per chart review patient seemingly utilizes the emergency department for primary doctor as he has multiple low acuity level complaints Labs:  Decision-making details documented in ED Course. Radiology: ordered and independent interpretation performed. Decision-making details documented in ED Course.  Risk Decision regarding hospitalization.           Final Clinical Impression(s) / ED Diagnoses Final diagnoses:  RSV infection  Secondary hypertension    Rx / DC Orders ED Discharge Orders     None         Coral Spikes, DO 11/02/23 1253

## 2023-11-02 NOTE — ED Triage Notes (Signed)
Cough/congestion since yesterday.  No known fever.  No acute respiratory distress noted.

## 2023-11-02 NOTE — Telephone Encounter (Cosign Needed)
Patient calling back to the emergency department due to not having "prescription strength" ibuprofen sent into the pharmacy.  Will send in ibuprofen to treat patient's symptoms.

## 2023-11-28 DIAGNOSIS — M9915 Subluxation complex (vertebral) of pelvic region: Secondary | ICD-10-CM | POA: Diagnosis not present

## 2023-11-28 DIAGNOSIS — M9913 Subluxation complex (vertebral) of lumbar region: Secondary | ICD-10-CM | POA: Diagnosis not present

## 2023-11-28 DIAGNOSIS — M9914 Subluxation complex (vertebral) of sacral region: Secondary | ICD-10-CM | POA: Diagnosis not present

## 2023-12-05 DIAGNOSIS — M9915 Subluxation complex (vertebral) of pelvic region: Secondary | ICD-10-CM | POA: Diagnosis not present

## 2023-12-05 DIAGNOSIS — M9914 Subluxation complex (vertebral) of sacral region: Secondary | ICD-10-CM | POA: Diagnosis not present

## 2023-12-05 DIAGNOSIS — M9913 Subluxation complex (vertebral) of lumbar region: Secondary | ICD-10-CM | POA: Diagnosis not present

## 2024-01-17 DIAGNOSIS — M9914 Subluxation complex (vertebral) of sacral region: Secondary | ICD-10-CM | POA: Diagnosis not present

## 2024-01-17 DIAGNOSIS — M9913 Subluxation complex (vertebral) of lumbar region: Secondary | ICD-10-CM | POA: Diagnosis not present

## 2024-01-17 DIAGNOSIS — M9915 Subluxation complex (vertebral) of pelvic region: Secondary | ICD-10-CM | POA: Diagnosis not present

## 2024-01-22 DIAGNOSIS — M9913 Subluxation complex (vertebral) of lumbar region: Secondary | ICD-10-CM | POA: Diagnosis not present

## 2024-01-22 DIAGNOSIS — M9914 Subluxation complex (vertebral) of sacral region: Secondary | ICD-10-CM | POA: Diagnosis not present

## 2024-01-22 DIAGNOSIS — M9915 Subluxation complex (vertebral) of pelvic region: Secondary | ICD-10-CM | POA: Diagnosis not present
